# Patient Record
Sex: Female | Born: 1975 | Race: White | Hispanic: No | Marital: Married | Smoking: Never smoker
Health system: Southern US, Community
[De-identification: ages and names within clinical notes are randomized; demographics above are authoritative.]

## PROBLEM LIST (undated history)

## (undated) DIAGNOSIS — G43909 Migraine, unspecified, not intractable, without status migrainosus: Secondary | ICD-10-CM

## (undated) DIAGNOSIS — Z8619 Personal history of other infectious and parasitic diseases: Secondary | ICD-10-CM

## (undated) DIAGNOSIS — D649 Anemia, unspecified: Secondary | ICD-10-CM

## (undated) HISTORY — PX: NOSE SURGERY: SHX723

## (undated) HISTORY — PX: JOINT REPLACEMENT: SHX530

## (undated) HISTORY — PX: ABDOMINAL HYSTERECTOMY: SHX81

## (undated) HISTORY — PX: DIAGNOSTIC LAPAROSCOPY: SUR761

## (undated) HISTORY — PX: OTHER SURGICAL HISTORY: SHX169

## (undated) HISTORY — PX: TUBAL LIGATION: SHX77

---

## 2003-03-28 ENCOUNTER — Other Ambulatory Visit: Payer: Self-pay

## 2004-05-25 ENCOUNTER — Observation Stay: Payer: Self-pay

## 2004-06-08 ENCOUNTER — Observation Stay: Payer: Self-pay

## 2004-06-13 ENCOUNTER — Observation Stay: Payer: Self-pay | Admitting: Obstetrics and Gynecology

## 2004-06-15 ENCOUNTER — Emergency Department: Payer: Self-pay | Admitting: Unknown Physician Specialty

## 2004-06-15 ENCOUNTER — Observation Stay: Payer: Self-pay | Admitting: Obstetrics and Gynecology

## 2004-07-05 ENCOUNTER — Observation Stay: Payer: Self-pay

## 2004-07-18 ENCOUNTER — Inpatient Hospital Stay: Payer: Self-pay | Admitting: Obstetrics and Gynecology

## 2004-08-18 ENCOUNTER — Ambulatory Visit: Payer: Self-pay | Admitting: Pediatrics

## 2005-07-07 ENCOUNTER — Ambulatory Visit: Payer: Self-pay | Admitting: Obstetrics and Gynecology

## 2005-09-04 ENCOUNTER — Ambulatory Visit: Payer: Self-pay | Admitting: Obstetrics and Gynecology

## 2006-05-15 ENCOUNTER — Ambulatory Visit: Payer: Self-pay | Admitting: Podiatry

## 2006-06-12 ENCOUNTER — Encounter: Payer: Self-pay | Admitting: Podiatry

## 2006-07-12 ENCOUNTER — Encounter: Payer: Self-pay | Admitting: Podiatry

## 2006-11-01 ENCOUNTER — Ambulatory Visit: Payer: Self-pay | Admitting: Family Medicine

## 2009-03-08 ENCOUNTER — Ambulatory Visit: Payer: Self-pay | Admitting: Internal Medicine

## 2009-03-09 ENCOUNTER — Ambulatory Visit: Payer: Self-pay | Admitting: Internal Medicine

## 2009-10-14 ENCOUNTER — Ambulatory Visit: Payer: Self-pay | Admitting: Family Medicine

## 2009-11-02 ENCOUNTER — Emergency Department: Payer: Self-pay | Admitting: Emergency Medicine

## 2009-11-05 ENCOUNTER — Ambulatory Visit: Payer: Self-pay | Admitting: Obstetrics and Gynecology

## 2010-04-21 ENCOUNTER — Ambulatory Visit: Payer: Self-pay | Admitting: Podiatry

## 2011-03-10 ENCOUNTER — Ambulatory Visit: Payer: Self-pay | Admitting: Obstetrics and Gynecology

## 2011-11-05 ENCOUNTER — Emergency Department: Payer: Self-pay | Admitting: Internal Medicine

## 2011-11-05 LAB — COMPREHENSIVE METABOLIC PANEL
Alkaline Phosphatase: 65 U/L (ref 50–136)
Anion Gap: 7 (ref 7–16)
Bilirubin,Total: 0.5 mg/dL (ref 0.2–1.0)
Chloride: 103 mmol/L (ref 98–107)
Co2: 29 mmol/L (ref 21–32)
Creatinine: 0.8 mg/dL (ref 0.60–1.30)
EGFR (African American): >60
Glucose: 88 mg/dL (ref 65–99)
Osmolality: 275 (ref 275–301)
SGOT(AST): 31 U/L (ref 15–37)
Sodium: 139 mmol/L (ref 136–145)
Total Protein: 8.6 g/dL — ABNORMAL HIGH (ref 6.4–8.2)

## 2011-11-05 LAB — CBC WITH DIFFERENTIAL/PLATELET
Basophil %: 1.1 %
Eosinophil %: 0.5 %
HCT: 42.3 % (ref 35.0–47.0)
HGB: 14.6 g/dL (ref 12.0–16.0)
MCH: 31.2 pg (ref 26.0–34.0)
MCV: 91 fL (ref 80–100)
Monocyte #: 0.5 x10 3/mm (ref 0.2–0.9)
Monocyte %: 8.3 %
Neutrophil #: 4.2 10*3/uL (ref 1.4–6.5)
Neutrophil %: 73.7 %
Platelet: 169 10*3/uL (ref 150–440)
RBC: 4.67 10*6/uL (ref 3.80–5.20)
WBC: 5.7 10*3/uL (ref 3.6–11.0)

## 2011-11-05 LAB — URINALYSIS, COMPLETE
Blood: NEGATIVE
Nitrite: NEGATIVE
Ph: 6 (ref 4.5–8.0)
Protein: NEGATIVE
RBC,UR: <1 /HPF (ref 0–5)
Specific Gravity: 1.001 (ref 1.003–1.030)
Squamous Epithelial: 2

## 2011-11-05 LAB — WET PREP, GENITAL

## 2011-11-07 ENCOUNTER — Ambulatory Visit: Payer: Self-pay | Admitting: Obstetrics and Gynecology

## 2011-11-07 LAB — URINALYSIS, COMPLETE
Leukocyte Esterase: NEGATIVE
Protein: NEGATIVE
RBC,UR: 2 /HPF (ref 0–5)
Specific Gravity: 1.021 (ref 1.003–1.030)
WBC UR: 2 /HPF (ref 0–5)

## 2012-02-19 ENCOUNTER — Ambulatory Visit: Payer: Self-pay | Admitting: Podiatry

## 2012-11-25 ENCOUNTER — Ambulatory Visit: Payer: Self-pay | Admitting: Orthopedic Surgery

## 2013-02-20 ENCOUNTER — Ambulatory Visit: Payer: Self-pay | Admitting: Orthopedic Surgery

## 2013-06-14 ENCOUNTER — Emergency Department: Payer: Self-pay | Admitting: Emergency Medicine

## 2013-06-14 LAB — CBC WITH DIFFERENTIAL/PLATELET
BASOS ABS: 0.1 10*3/uL (ref 0.0–0.1)
BASOS PCT: 0.8 %
Eosinophil #: 0 10*3/uL (ref 0.0–0.7)
Eosinophil %: 0.1 %
HCT: 42 % (ref 35.0–47.0)
HGB: 14.1 g/dL (ref 12.0–16.0)
LYMPHS ABS: 0.2 10*3/uL — AB (ref 1.0–3.6)
Lymphocyte %: 3.2 %
MCH: 30.5 pg (ref 26.0–34.0)
MCHC: 33.6 g/dL (ref 32.0–36.0)
MCV: 91 fL (ref 80–100)
MONO ABS: 0.2 x10 3/mm (ref 0.2–0.9)
Monocyte %: 2.9 %
Neutrophil #: 6.1 10*3/uL (ref 1.4–6.5)
Neutrophil %: 93 %
PLATELETS: 138 10*3/uL — AB (ref 150–440)
RBC: 4.63 10*6/uL (ref 3.80–5.20)
RDW: 13.5 % (ref 11.5–14.5)
WBC: 6.6 10*3/uL (ref 3.6–11.0)

## 2013-06-14 LAB — URINALYSIS, COMPLETE
BLOOD: NEGATIVE
Bilirubin,UR: NEGATIVE
GLUCOSE, UR: NEGATIVE mg/dL (ref 0–75)
Leukocyte Esterase: NEGATIVE
Nitrite: NEGATIVE
Ph: 5 (ref 4.5–8.0)
Protein: 30
Specific Gravity: 1.02 (ref 1.003–1.030)
Squamous Epithelial: 12
WBC UR: 5 /HPF (ref 0–5)

## 2013-06-14 LAB — COMPREHENSIVE METABOLIC PANEL
ALBUMIN: 4.2 g/dL (ref 3.4–5.0)
ALK PHOS: 64 U/L
ALT: 20 U/L (ref 12–78)
AST: 28 U/L (ref 15–37)
Anion Gap: 5 — ABNORMAL LOW (ref 7–16)
BILIRUBIN TOTAL: 0.4 mg/dL (ref 0.2–1.0)
BUN: 10 mg/dL (ref 7–18)
CALCIUM: 9 mg/dL (ref 8.5–10.1)
CHLORIDE: 102 mmol/L (ref 98–107)
CO2: 30 mmol/L (ref 21–32)
Creatinine: 0.81 mg/dL (ref 0.60–1.30)
EGFR (African American): >60
Glucose: 110 mg/dL — ABNORMAL HIGH (ref 65–99)
Osmolality: 274 (ref 275–301)
Potassium: 3.4 mmol/L — ABNORMAL LOW (ref 3.5–5.1)
Sodium: 137 mmol/L (ref 136–145)
TOTAL PROTEIN: 8.3 g/dL — AB (ref 6.4–8.2)

## 2013-06-14 LAB — LIPASE, BLOOD: LIPASE: 75 U/L (ref 73–393)

## 2014-06-30 NOTE — Op Note (Signed)
PATIENT NAME:  Joy Brown, Joy Brown MR#:  [id number] DATE OF BIRTH:  20-Oct-1975  DATE OF PROCEDURE:  11/07/2011  PREOPERATIVE DIAGNOSES: 1. Right-sided pelvic pain.  2. Solid "exophytic lesion of the right adnexa".   POSTOPERATIVE DIAGNOSES: 1. Right-sided pelvic pain.  2. Solid "exophytic lesion of the right adnexa".  3. Firm appendix. 4. Adherent, firm right fallopian tube.  PROCEDURES:  1. Diagnostic laparoscopy.  2. Right salpingectomy.  3. Moderate adhesiolysis.  4. Appendectomy.   SURGEON: Ricky L. Logan BoresEvans, Brown.D.   ANESTHESIA: General endotracheal.   FINDINGS: As above, grossly normal ovaries bilaterally, grossly normal left fallopian tube, grossly normal cervical stump.   ESTIMATED BLOOD LOSS: Minimal.   COMPLICATIONS: None.   SPECIMENS: None.   IV FLUIDS: 1100 mL crystalloid.   PROCEDURE IN DETAIL: The patient presented to the ER Sunday with ultrasound findings as noted above, seen in the office yesterday with continued pain. Discussed options and she elected to proceed with diagnostic laparoscopy. Consent was signed.   She was taken to the operating room and placed in the supine position where anesthesia was initiated. She was placed in the dorsal lithotomy position using Jowers stirrups, prepped and draped in the usual sterile fashion. A sponge stick was placed. Foley catheter was placed and we turned our attention to the abdomen.   An 11 port was placed infraumbilically and a 5  port was placed in the left lower quadrant under direct visualization after establishment of pneumoperitoneum with findings as noted above.   An 11 port was placed in the right lower quadrant. We proceeded to dissect free a solid mass that was adherent to the right cuff. It is possibly an endometrioma. There was no evidence of pustulant material. After approximately 15 minutes of dissection, we were able to visualize and mobilize a firm indurated fallopian tube, which then removed with the Harmonic  scalpel. This was placed in an Endo Catch bag and removed through the right lower quadrant port. Next we turned our attention to the appendix.    The appendix was visualized and seemed to be somewhat firm and rubbery. I elected to proceed with appendectomy, which was carried out with preloaded clips. Stump was irrigated and there was no evidence of oozing iether in the adnexal area or tyhe mesoappendiceal region. Pressure was lowered to 6 mmHg and the pelvis was copiously irrigated. Both surgical sites were seen to be hemostatic. The procedure was felt to achieve maximum efficacy. Ports removed. Incision was closed with deep of zero, subcutaneous with 3-0 Vicryl. Steri-Strips and Band-Aids were placed.   The Foley catheter was removed at the end of the case as was the sponge stick. The patient tolerated the procedure well. I anticipate a routine postoperative course.    ____________________________ Reatha Harpsicky L. Logan BoresEvans, MD rle:bjt D: 11/07/2011 11:20:23 ET T: 11/07/2011 11:51:17 ET JOB#: [id number]  cc: Ricky L. Logan BoresEvans, MD, <Dictator> Augustina MoodICK L Taivon Haroon MD ELECTRONICALLY SIGNED 11/08/2011 9:52

## 2014-07-03 NOTE — Op Note (Signed)
PATIENT NAME:  Joy Brown, Joy Brown MR#:  [id number] DATE OF BIRTH:  1976-02-29  DATE OF PROCEDURE:  02/20/2013  PREOPERATIVE DIAGNOSIS: Left knee anterior cruciate ligament tear.   POSTOPERATIVE DIAGNOSIS: Left knee anterior cruciate ligament tear.   PROCEDURES PERFORMED:  1.  Left knee anterior cruciate ligament reconstruction. 2.  Left knee plica excision.   SURGEON: Murlean Hark M.D.   ASSISTANT: Dedra Skeens, [place]   ANESTHESIA: Femoral nerve block and general anesthesia.   ESTIMATED BLOOD LOSS: Minimal.   TOURNIQUET TIME: Zero minutes.  SURGICAL FINDINGS: A complete tear of ACL with scarring to the PCL. Medial plica band with underlying femoral trochlear inflammation.  COMPLICATIONS: No immediate intraoperative or postoperative complications were noted.   DISPOSITION: The patient will be discharged home the same day of surgery. She will follow up in the office next week.   INDICATIONS FOR PROCEDURE: Joy Brown is a 39 year old female who presented to the office for evaluation of aforementioned injury. She delayed surgery until December due to some work restrictions at her job. She is in today for planned surgery. Risks and benefits have been explained to the patient. All questions have been answered.   DESCRIPTION OF PROCEDURE: Joy Brown was identified in the preoperative holding area. The left lower extremity was marked as the operative site. Consent form was reviewed. Questions were again answered. Femoral nerve block was administered in the preoperative holding area. The patient was brought into the operating room and placed on the table in supine position. General anesthesia was administered.   Tourniquet was applied to the left lower extremity. The left lower extremity was prepared and draped in the usual sterile fashion.   Surgical timeout was performed identifying patient, procedure, laterality, imaging studies, consent form, preoperative antibiotics, and skin preparation.    Standard lateral viewing portal was made. Arthroscope was inserted into the patellofemoral compartment. Patellofemoral compartment was found to have nice central articulation without any significant chondral wear. Trochlear groove had no chondral wear. There was mild synovitis. A plica band was found in the medial aspect with some underlying inflammation on the femoral condyle. Attention was turned to the medial and lateral gutters. No loose bodies were noted. Attention was turned to the medial compartment. Under direct visualization, medial portal was made. Probe was inserted. Medial meniscus was found to have a small radial tear of the anterior horn. This was gently debrided using an incisor shaver. The body and posterior horn were completely intact and stable. The medial femoral condyle and medial tibial plateau were probed and no chondral softening was noted.   Attention was turned to the notch. ACL was found to be completely torn and scarred to the PCL. The lateral wall of the femoral notch was completely empty of any ACL insertion. Attention was turned to the lateral compartment. The lateral compartment revealed intact meniscus. On probing of the posterior horn, there was a small area of a transverse undersurface tear that was completely stable and did not communicate all the way through to the upper surface of the meniscus. There was a very small area of chondral fraying directly in line of this area that was gently debrided with a chondral shaver. The remainder of the chondral surface appeared completely intact.   Attention was returned to the patellofemoral articulation. Using a shaver, the medial plical band was excised.   At this time, a shaver was inserted into the notch and the residual ACL stump was carefully debrided away. Care was taken to preserve the complete  integrity of the PCL. The lateral wall of the femoral notch was debrided of all soft tissue. A small notchplasty was performed to  widen the very narrow notch.   The retro-femoral cutting guide was inserted through the lateral portal while the camera was placed in the medial portal. Lateral incision was made and sharp dissection was carried down through the iliotibial band. Sleeve was depressed down to the bone. Slip cutter drill measuring 10 mm was inserted with appropriate placement on the notch being confirmed intra-articularly. Femoral socket was retro-drilled. Socket was maintained by bone on all sides. The graft passing suture was inserted into the femoral socket and withdrawn through the lateral portal. At this time, the camera was moved to the lateral portal and the anterograde tibial guide was inserted through the medial portal. Skin incision was made on the anteromedial aspect of the tibia and dissection was carried down to bone. A pin was placed for the tibial tunnel at the anterior aspect of the ACL insertion and in line with the anterior horn of the lateral meniscus. This was overdrilled using a 10 mm drill. Tibial tunnel was sequentially dilated to 10 mm.   At this time, the joint was flushed of all bone debris. The passing suture was brought down through the tibial tunnel. An ACL graft link, 9.5 mm x 69 mm, prepared by Arth[employer], was inserted. The femoral tightrope button was brought into the femoral socket and under direct visualization was passed through the lateral femoral cortex and slipped on the outside of the bone. Placement of this femoral button was confirmed on fluoroscopy.   The ACL was now hoisted with gentle resistance placed through the tibial side into the femoral socket. When 20 mm, based on our markings, was nicely seated into the femoral socket, the tibial button was applied. The tibia was now tensioned into the tunnel using the same tightrope mechanism. When 20 mm of graft was seated in the tibial tunnel, both ends were again definitively tightened. The graft was found to have excellent tension in the  joint. The knee was completely extended and no impingement was noted. At this time, the camera was removed from the joint. The tibial incision was cleaned of any soft tissue. A 4.75 x 19.1 mm SwiveLock was inserted into the anterior tibia using standard technique for secondary fixation of the ACL graft. At this time, all wounds were very copiously irrigated. The deep tissue and iliotibial band was closed using 0 Vicryl suture. Subcutaneous tissue was closed using 2-0 Vicryl suture. Skin was closed using 3-0 nylon suture. Sterile dressings were applied. TENS leads were applied. Polar Care was applied. The patient was placed in a hinged knee brace, locked in extension. Joy Brown will be discharged home the same day of surgery. She will follow up in the office next week.  ____________________________ Murlean HarkShalini Dmario Russom, MD sr:sb D: 02/21/2013 15:44:14 ET T: 02/21/2013 16:01:32 ET JOB#: [id number]  cc: Murlean HarkShalini Daysha Ashmore, MD, <Dictator> Murlean HarkSHALINI Dominigue Gellner MD ELECTRONICALLY SIGNED 02/28/2013 15:12

## 2014-07-13 ENCOUNTER — Other Ambulatory Visit: Payer: Self-pay | Admitting: Obstetrics and Gynecology

## 2014-07-13 DIAGNOSIS — N644 Mastodynia: Secondary | ICD-10-CM

## 2014-07-21 ENCOUNTER — Other Ambulatory Visit: Payer: Self-pay | Admitting: Obstetrics and Gynecology

## 2014-07-21 DIAGNOSIS — Z1231 Encounter for screening mammogram for malignant neoplasm of breast: Secondary | ICD-10-CM

## 2014-07-28 ENCOUNTER — Ambulatory Visit
Admission: RE | Admit: 2014-07-28 | Discharge: 2014-07-28 | Disposition: A | Discharge status: Home / Self Care | Payer: BLUE CROSS/BLUE SHIELD | Source: Ambulatory Visit | Attending: Obstetrics and Gynecology | Admitting: Obstetrics and Gynecology

## 2014-07-28 DIAGNOSIS — Z1231 Encounter for screening mammogram for malignant neoplasm of breast: Secondary | ICD-10-CM | POA: Insufficient documentation

## 2015-06-16 ENCOUNTER — Other Ambulatory Visit: Payer: Self-pay | Admitting: Obstetrics and Gynecology

## 2015-06-16 DIAGNOSIS — Z1231 Encounter for screening mammogram for malignant neoplasm of breast: Secondary | ICD-10-CM

## 2015-08-04 ENCOUNTER — Ambulatory Visit
Admission: RE | Admit: 2015-08-04 | Discharge: 2015-08-04 | Disposition: A | Discharge status: Home / Self Care | Payer: BLUE CROSS/BLUE SHIELD | Source: Ambulatory Visit | Attending: Obstetrics and Gynecology | Admitting: Obstetrics and Gynecology

## 2015-08-04 DIAGNOSIS — Z1231 Encounter for screening mammogram for malignant neoplasm of breast: Secondary | ICD-10-CM | POA: Insufficient documentation

## 2015-08-05 ENCOUNTER — Other Ambulatory Visit: Payer: Self-pay | Admitting: Obstetrics and Gynecology

## 2015-08-05 DIAGNOSIS — N6459 Other signs and symptoms in breast: Secondary | ICD-10-CM

## 2015-08-06 ENCOUNTER — Ambulatory Visit
Admission: RE | Admit: 2015-08-06 | Discharge: 2015-08-06 | Disposition: A | Discharge status: Home / Self Care | Payer: BLUE CROSS/BLUE SHIELD | Source: Ambulatory Visit | Attending: Obstetrics and Gynecology | Admitting: Obstetrics and Gynecology

## 2015-08-06 DIAGNOSIS — N6459 Other signs and symptoms in breast: Secondary | ICD-10-CM

## 2015-08-06 DIAGNOSIS — N6489 Other specified disorders of breast: Secondary | ICD-10-CM | POA: Diagnosis not present

## 2015-08-06 DIAGNOSIS — R6889 Other general symptoms and signs: Secondary | ICD-10-CM | POA: Diagnosis present

## 2016-07-11 ENCOUNTER — Other Ambulatory Visit: Payer: Self-pay | Admitting: Obstetrics and Gynecology

## 2016-07-11 DIAGNOSIS — Z1231 Encounter for screening mammogram for malignant neoplasm of breast: Secondary | ICD-10-CM

## 2016-08-25 ENCOUNTER — Ambulatory Visit
Admission: RE | Admit: 2016-08-25 | Discharge: 2016-08-25 | Disposition: A | Discharge status: Home / Self Care | Payer: BLUE CROSS/BLUE SHIELD | Source: Ambulatory Visit | Attending: Obstetrics and Gynecology | Admitting: Obstetrics and Gynecology

## 2016-08-25 DIAGNOSIS — Z1231 Encounter for screening mammogram for malignant neoplasm of breast: Secondary | ICD-10-CM

## 2016-08-29 ENCOUNTER — Other Ambulatory Visit: Payer: Self-pay | Admitting: Obstetrics and Gynecology

## 2016-08-29 DIAGNOSIS — Z1231 Encounter for screening mammogram for malignant neoplasm of breast: Secondary | ICD-10-CM

## 2016-09-01 ENCOUNTER — Ambulatory Visit
Admission: RE | Admit: 2016-09-01 | Discharge: 2016-09-01 | Disposition: A | Discharge status: Home / Self Care | Payer: BLUE CROSS/BLUE SHIELD | Source: Ambulatory Visit | Attending: Obstetrics and Gynecology | Admitting: Obstetrics and Gynecology

## 2016-09-01 DIAGNOSIS — Z1231 Encounter for screening mammogram for malignant neoplasm of breast: Secondary | ICD-10-CM | POA: Insufficient documentation

## 2016-09-04 ENCOUNTER — Other Ambulatory Visit: Payer: Self-pay | Admitting: Obstetrics and Gynecology

## 2016-09-04 DIAGNOSIS — R928 Other abnormal and inconclusive findings on diagnostic imaging of breast: Secondary | ICD-10-CM

## 2016-09-04 DIAGNOSIS — N6489 Other specified disorders of breast: Secondary | ICD-10-CM

## 2016-09-08 ENCOUNTER — Ambulatory Visit
Admission: RE | Admit: 2016-09-08 | Discharge: 2016-09-08 | Disposition: A | Discharge status: Home / Self Care | Payer: BLUE CROSS/BLUE SHIELD | Source: Ambulatory Visit | Attending: Obstetrics and Gynecology | Admitting: Obstetrics and Gynecology

## 2016-09-08 DIAGNOSIS — R928 Other abnormal and inconclusive findings on diagnostic imaging of breast: Secondary | ICD-10-CM

## 2016-09-08 DIAGNOSIS — N6489 Other specified disorders of breast: Secondary | ICD-10-CM

## 2017-02-17 ENCOUNTER — Emergency Department: Payer: BLUE CROSS/BLUE SHIELD

## 2017-02-17 ENCOUNTER — Emergency Department
Admission: EM | Admit: 2017-02-17 | Discharge: 2017-02-17 | Disposition: A | Discharge status: Home / Self Care | Payer: BLUE CROSS/BLUE SHIELD | Attending: Emergency Medicine | Admitting: Emergency Medicine

## 2017-02-17 ENCOUNTER — Encounter: Payer: Self-pay | Admitting: Emergency Medicine

## 2017-02-17 DIAGNOSIS — G43909 Migraine, unspecified, not intractable, without status migrainosus: Secondary | ICD-10-CM | POA: Insufficient documentation

## 2017-02-17 DIAGNOSIS — R51 Headache: Secondary | ICD-10-CM | POA: Diagnosis present

## 2017-02-17 DIAGNOSIS — Z91040 Latex allergy status: Secondary | ICD-10-CM | POA: Diagnosis not present

## 2017-02-17 DIAGNOSIS — R112 Nausea with vomiting, unspecified: Secondary | ICD-10-CM | POA: Diagnosis not present

## 2017-02-17 HISTORY — DX: Migraine, unspecified, not intractable, without status migrainosus: G43.909

## 2017-02-17 MED ORDER — ACETAMINOPHEN 500 MG PO TABS
1000.0000 mg | ORAL_TABLET | Freq: Once | ORAL | Status: AC
  Administered 2017-02-17: 1000 mg via ORAL
  Filled 2017-02-17: qty 2

## 2017-02-17 MED ORDER — MORPHINE SULFATE (PF) 4 MG/ML IV SOLN
4.0000 mg | Freq: Once | INTRAVENOUS | Status: AC
  Administered 2017-02-17: 4 mg via INTRAVENOUS

## 2017-02-17 MED ORDER — MAGNESIUM SULFATE 2 GM/50ML IV SOLN
2.0000 g | Freq: Once | INTRAVENOUS | Status: AC
  Administered 2017-02-17: 2 g via INTRAVENOUS
  Filled 2017-02-17: qty 50

## 2017-02-17 MED ORDER — METOCLOPRAMIDE HCL 5 MG/ML IJ SOLN
10.0000 mg | INTRAMUSCULAR | Status: AC
  Administered 2017-02-17: 10 mg via INTRAVENOUS
  Filled 2017-02-17: qty 2

## 2017-02-17 MED ORDER — SODIUM CHLORIDE 0.9 % IV BOLUS (SEPSIS)
500.0000 mL | INTRAVENOUS | Status: AC
  Administered 2017-02-17: 500 mL via INTRAVENOUS

## 2017-02-17 MED ORDER — ONDANSETRON HCL 4 MG/2ML IJ SOLN
INTRAMUSCULAR | Status: AC
  Filled 2017-02-17: qty 2

## 2017-02-17 MED ORDER — DEXAMETHASONE SODIUM PHOSPHATE 10 MG/ML IJ SOLN
10.0000 mg | Freq: Once | INTRAMUSCULAR | Status: AC
  Administered 2017-02-17: 10 mg via INTRAVENOUS
  Filled 2017-02-17: qty 1

## 2017-02-17 MED ORDER — ONDANSETRON HCL 4 MG/2ML IJ SOLN
4.0000 mg | Freq: Once | INTRAMUSCULAR | Status: AC
  Administered 2017-02-17: 4 mg via INTRAVENOUS

## 2017-02-17 MED ORDER — DIPHENHYDRAMINE HCL 50 MG/ML IJ SOLN
25.0000 mg | INTRAMUSCULAR | Status: AC
Start: 2017-02-17 — End: 2017-02-17
  Administered 2017-02-17: 25 mg via INTRAVENOUS
  Filled 2017-02-17: qty 1

## 2017-02-17 MED ORDER — MORPHINE SULFATE (PF) 2 MG/ML IV SOLN
INTRAVENOUS | Status: AC
  Filled 2017-02-17: qty 2

## 2017-02-17 MED ORDER — KETOROLAC TROMETHAMINE 30 MG/ML IJ SOLN
15.0000 mg | Freq: Once | INTRAMUSCULAR | Status: AC
  Administered 2017-02-17: 15 mg via INTRAVENOUS
  Filled 2017-02-17: qty 1

## 2017-02-17 NOTE — ED Triage Notes (Signed)
Pt states headache since 1900 yesterday. Pt states emesis since 0200. Pt with left sided eyelid ptosis noted that pt states she has never noticed. Pt describes as "really bad, maybe the worst headache i've had." pt actively vomiting in triage.

## 2017-02-17 NOTE — ED Notes (Signed)
Pt provided with call bell and warm blanket.

## 2017-02-17 NOTE — ED Notes (Signed)
Patient transported to CT 

## 2017-02-17 NOTE — Discharge Instructions (Signed)
You have been seen in the Emergency Department (ED) for a migraine.  Please use Tylenol or Motrin as needed for symptoms, but only as written on the box, and take any regular medications that have been prescribed for you. As we have discussed, please follow up with your doctor as soon as possible regarding today?s ED visit and your headache symptoms.    Of note, as we discussed, your CT scan suggested the possibility of a condition called a Chiari malformation.  I feel it is unlikely that it is causing your acute headache tonight, but you may want to discuss it with Dr. Malvin JohnsPotter and see if he feels it is appropriate to order an outpatient MRI or other imaging study for further evaluation.  Call your doctor or return to the Emergency Department (ED) if you have a worsening headache, sudden and severe headache, confusion, slurred speech, facial droop, weakness or numbness in any arm or leg, extreme fatigue, or other symptoms that concern you.

## 2017-02-17 NOTE — ED Provider Notes (Signed)
[facility] Emergency Department Provider Note  ____________________________________________   First MD Initiated Contact with Patient 02/17/17 [phone]     (approximate)  I have reviewed the triage vital signs and the nursing notes.   HISTORY  Chief Complaint Headache    HPI Joy Brown is a 41 y.o. female with a history of frequent and severe chronic migraines who presents for evaluation of gradually worsening global headache that started yesterday and has been severe.  She developed some nausea and vomiting prior to her arrival in the emergency department.  She took her regular migraine medication in the evening but it did not help.  She has not noticed any numbness or tingling in extremities, facial droop, or other focal neurological deficits.  She states that her pain feels like her usual migraine except much more severe.  Nothing has made it better and moving around and light makes it worse.   It was noted in triage by the nurse that it seemed like she had a bit of left eyelid ptosis but the patient had not observed any change from baseline, nor has her spouse.  She denies any recent fever/chills, chest pain, shortness of breath, abdominal pain, and dysuria.  She sees Dr. Malvin Johns with neurology for management of her migraines.  Past Medical History:  Diagnosis Date  . Migraines     There are no active problems to display for this patient.   History reviewed. No pertinent surgical history.  Prior to Admission medications   Not on File    Allergies Latex  Family History  Problem Relation Age of Onset  . Cancer Father        non hodginks lymphoma  . Cancer Maternal Grandmother        vulva    Social History Social History   Tobacco Use  . Smoking status: Never Smoker  . Smokeless tobacco: Never Used  Substance Use Topics  . Alcohol use: Yes    Comment: occasional  . Drug use: No    Review of Systems Constitutional: No  fever/chills Eyes: No visual changes. +Photophobia ENT: No sore throat. Cardiovascular: Denies chest pain. Respiratory: Denies shortness of breath. Gastrointestinal: No abdominal pain.  Nausea associated with headache and one episode of emesis.  No diarrhea.  No constipation. Genitourinary: Negative for dysuria. Musculoskeletal: Negative for neck pain.  Negative for back pain. Integumentary: Negative for rash. Neurological: Negative for headaches, focal weakness or numbness.   ____________________________________________   PHYSICAL EXAM:  VITAL SIGNS: ED Triage Vitals  Enc Vitals Group     BP 02/17/17 0328 135/67     Pulse Rate 02/17/17 0328 90     Resp 02/17/17 0328 (!) 22     Temp 02/17/17 0328 98 F (36.7 C)     Temp Source 02/17/17 0328 Oral     SpO2 02/17/17 0328 100 %     Weight 02/17/17 0329 74.8 kg (165 lb)     Height 02/17/17 0329 1.651 m (5\' 5" )     Head Circumference --      Peak Flow --      Pain Score 02/17/17 0328 10     Pain Loc --      Pain Edu? --      Excl. in GC? --     Constitutional: Alert and oriented.  Generally well-appearing but does appear quite uncomfortable Eyes: Conjunctivae are normal. PERRL. EOMI. Head: Atraumatic. Cardiovascular: Normal rate, regular rhythm. Good peripheral circulation. Grossly normal heart sounds.  Respiratory: Normal respiratory effort.  No retractions. Lungs CTAB. Gastrointestinal: Soft and nontender. No distention.  Musculoskeletal: No lower extremity tenderness nor edema. No gross deformities of extremities. Neurologic:  Normal speech and language. No gross focal neurologic deficits are appreciated.  Although it is possible that her left eyelid appears slightly asymmetrical compared to the right, she has no appreciable cranial nerve deficits and I do not appreciate ptosis Skin:  Skin is warm, dry and intact. No rash noted. Psychiatric: Mood and affect are normal. Speech and behavior are  normal.  ____________________________________________   LABS (all labs ordered are listed, but only abnormal results are displayed)  Labs Reviewed - No data to display ____________________________________________  EKG  None - EKG not ordered by ED physician ____________________________________________  RADIOLOGY   Ct Head Wo Contrast  Result Date: 02/17/2017 CLINICAL DATA:  Headache. EXAM: CT HEAD WITHOUT CONTRAST TECHNIQUE: Contiguous axial images were obtained from the base of the skull through the vertex without intravenous contrast. COMPARISON:  None. FINDINGS: Brain: No subdural, epidural, or subarachnoid hemorrhage identified. The cerebellar tonsils appear to be low lying suggesting a Chiari malformation, incompletely evaluated. The cerebellum is otherwise normal. The brainstem and basal cisterns are normal. The ventricles are normal. Basal ganglia are normal. No acute cortical ischemia or infarct. No mass effect or midline shift. Vascular: No hyperdense vessel or unexpected calcification. Skull: Normal. Negative for fracture or focal lesion. Sinuses/Orbits: No acute finding. Other: None. IMPRESSION: 1. 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 2. No other intracranial abnormalities identified. Electronically Signed   By: Gerome Samavid  Williams III M.D   On: 02/17/2017 05:39    ____________________________________________   PROCEDURES  Critical Care performed: No   Procedure(s) performed:   Procedures   ____________________________________________   INITIAL IMPRESSION / ASSESSMENT AND PLAN / ED COURSE  As part of my medical decision making, I reviewed the following data within the electronic MEDICAL RECORD NUMBER Nursing notes reviewed and incorporated and Old chart reviewed    Differential diagnosis includes, but is not limited to, intracranial hemorrhage, meningitis/encephalitis, previous head trauma, cavernous venous thrombosis,  tension headache, temporal arteritis, migraine or migraine equivalent, idiopathic intracranial hypertension, and non-specific headache.  I appreciate the mention of possible left eyelid ptosis in the triage note, but on exam I do not feel that the patient's physical exam is consistent with ptosis.  Additionally even if that was present it could be a result of a complicated migraine.  Her CT scan is reassuring and I do not feel she would benefit from additional imaging at this point to rule out more unusual diagnoses such as sinus thromboses.  The CT scan did mention the possibility of Chiari malformation due to low hanging cerebellar tonsils, but for this patient with chronic migraines I think it is worth treating empirically for migraine first and having her follow-up with Dr. Malvin JohnsPotter as an outpatient to see if he wants to perform additional imaging.  If her pain is unrelieved after empiric migraine treatment, I will consider additional imaging to rule out acute or emergent condition such as cavernous venous thrombosis and acute complications of Chiari malformation.  I discussed all of this with the patient and her husband and they understand and agree with the plan.  Clinical Course as of Feb 17 757  Sat Feb 17, 2017  [id number] The patient is feeling much better.  Her medications are not done yet but she feels comfortable with the plan to go home after her medications  have completed.  I reiterated my return precautions and recommendations for outpatient follow-up with her neurologist.  She understands and agrees with the plan.  [CF]    Clinical Course User Index [CF] Loleta RoseForbach, Hailey Miles, MD    ____________________________________________  FINAL CLINICAL IMPRESSION(S) / ED DIAGNOSES  Final diagnoses:  Migraine without status migrainosus, not intractable, unspecified migraine type     MEDICATIONS GIVEN DURING THIS VISIT:  Medications  morphine 2 MG/ML injection (not administered)  morphine 4 MG/ML  injection 4 mg (4 mg Intravenous Given 02/17/17 0340)  ondansetron (ZOFRAN) injection 4 mg (4 mg Intravenous Given 02/17/17 0340)  sodium chloride 0.9 % bolus 500 mL (0 mLs Intravenous Stopped 02/17/17 0722)  ketorolac (TORADOL) 30 MG/ML injection 15 mg (15 mg Intravenous Given 02/17/17 0644)  metoCLOPramide (REGLAN) injection 10 mg (10 mg Intravenous Given 02/17/17 0645)  dexamethasone (DECADRON) injection 10 mg (10 mg Intravenous Given 02/17/17 0648)  diphenhydrAMINE (BENADRYL) injection 25 mg (25 mg Intravenous Given 02/17/17 0647)  magnesium sulfate IVPB 2 g 50 mL (0 g Intravenous Stopped 02/17/17 0758)  acetaminophen (TYLENOL) tablet 1,000 mg (1,000 mg Oral Given 02/17/17 [id number])     ED Discharge Orders    None       Note:  This document was prepared using Dragon voice recognition software and may include unintentional dictation errors.    Loleta RoseForbach, Anita Mcadory, MD 02/17/17 [phone]

## 2017-02-17 NOTE — ED Triage Notes (Signed)
Patient in recliner chair in triage with eyes closed. Patient in no acute distress at this time.

## 2017-03-01 ENCOUNTER — Other Ambulatory Visit: Payer: Self-pay | Admitting: Obstetrics and Gynecology

## 2017-03-01 DIAGNOSIS — Z1231 Encounter for screening mammogram for malignant neoplasm of breast: Secondary | ICD-10-CM

## 2017-07-29 ENCOUNTER — Other Ambulatory Visit: Payer: Self-pay

## 2017-07-29 ENCOUNTER — Emergency Department
Admission: EM | Admit: 2017-07-29 | Discharge: 2017-07-29 | Disposition: A | Discharge status: Home / Self Care | Payer: BLUE CROSS/BLUE SHIELD | Attending: Emergency Medicine | Admitting: Emergency Medicine

## 2017-07-29 DIAGNOSIS — Z91040 Latex allergy status: Secondary | ICD-10-CM | POA: Insufficient documentation

## 2017-07-29 DIAGNOSIS — J0100 Acute maxillary sinusitis, unspecified: Secondary | ICD-10-CM | POA: Diagnosis not present

## 2017-07-29 DIAGNOSIS — R51 Headache: Secondary | ICD-10-CM | POA: Diagnosis present

## 2017-07-29 DIAGNOSIS — G43001 Migraine without aura, not intractable, with status migrainosus: Secondary | ICD-10-CM | POA: Diagnosis not present

## 2017-07-29 MED ORDER — KETOROLAC TROMETHAMINE 30 MG/ML IJ SOLN
30.0000 mg | Freq: Once | INTRAMUSCULAR | Status: AC
  Administered 2017-07-29: 30 mg via INTRAVENOUS
  Filled 2017-07-29: qty 1

## 2017-07-29 MED ORDER — MORPHINE SULFATE (PF) 4 MG/ML IV SOLN
4.0000 mg | Freq: Once | INTRAVENOUS | Status: AC
  Administered 2017-07-29: 4 mg via INTRAVENOUS
  Filled 2017-07-29: qty 1

## 2017-07-29 MED ORDER — SODIUM CHLORIDE 0.9 % IV BOLUS
500.0000 mL | Freq: Once | INTRAVENOUS | Status: AC
  Administered 2017-07-29: 500 mL via INTRAVENOUS

## 2017-07-29 MED ORDER — FEXOFENADINE-PSEUDOEPHED ER 60-120 MG PO TB12: 1.0000 | ORAL_TABLET | Freq: Two times a day (BID) | ORAL | 0 refills | Status: DC

## 2017-07-29 MED ORDER — AMOXICILLIN 875 MG PO TABS: 875.0000 mg | ORAL_TABLET | Freq: Two times a day (BID) | ORAL | 0 refills | Status: DC

## 2017-07-29 MED ORDER — DIPHENHYDRAMINE HCL 50 MG/ML IJ SOLN
25.0000 mg | Freq: Once | INTRAMUSCULAR | Status: AC
  Administered 2017-07-29: 25 mg via INTRAVENOUS
  Filled 2017-07-29: qty 1

## 2017-07-29 MED ORDER — METOCLOPRAMIDE HCL 5 MG/ML IJ SOLN
10.0000 mg | Freq: Once | INTRAMUSCULAR | Status: AC
  Administered 2017-07-29: 10 mg via INTRAVENOUS
  Filled 2017-07-29: qty 2

## 2017-07-29 MED ORDER — ONDANSETRON 4 MG PO TBDP
4.0000 mg | ORAL_TABLET | Freq: Once | ORAL | Status: AC
  Administered 2017-07-29: 4 mg via ORAL
  Filled 2017-07-29: qty 1

## 2017-07-29 MED ORDER — ONDANSETRON HCL 4 MG/2ML IJ SOLN
4.0000 mg | Freq: Once | INTRAMUSCULAR | Status: AC
  Administered 2017-07-29: 4 mg via INTRAVENOUS
  Filled 2017-07-29: qty 2

## 2017-07-29 NOTE — Discharge Instructions (Addendum)
Continue previous medications

## 2017-07-29 NOTE — ED Triage Notes (Signed)
Patient reports having a migraine since early Saturday morning with nausea and vomiting.

## 2017-07-29 NOTE — ED Provider Notes (Signed)
[facility] Emergency Department Provider Note   ____________________________________________   First MD Initiated Contact with Patient 07/29/17 0801     (approximate)  I have reviewed the triage vital signs and the nursing notes.   HISTORY  Chief Complaint Migraine    HPI Joy Brown is a 42 y.o. female patient complain of migraine headaches associated with nausea and vomiting started yesterday morning.  Patient has a history of migraines followed by neurology.  Patient stated normally Imitrex has controlled her headache.  Patient also complaining of right ear and facial pain.  Patient denies fever or chills associated with this complaint.  Patient state photophobia secondary to migraine headaches.  Patient denies vertigo or weakness.  Patient rates the pain as a 10/10.  Patient described the pain is "aching".  Past Medical History:  Diagnosis Date  . Migraines     There are no active problems to display for this patient.   No past surgical history on file.  Prior to Admission medications   Medication Sig Start Date End Date Taking? Authorizing Provider  amoxicillin (AMOXIL) 875 MG tablet Take 1 tablet (875 mg total) by mouth 2 (two) times daily. 07/29/17   Joni Reining, PA-C  fexofenadine-pseudoephedrine (ALLEGRA-D) 60-120 MG 12 hr tablet Take 1 tablet by mouth 2 (two) times daily. 07/29/17   Joni Reining, PA-C    Allergies Latex  Family History  Problem Relation Age of Onset  . Cancer Father        non hodginks lymphoma  . Cancer Maternal Grandmother        vulva    Social History Social History   Tobacco Use  . Smoking status: Never Smoker  . Smokeless tobacco: Never Used  Substance Use Topics  . Alcohol use: Yes    Comment: occasional  . Drug use: No    Review of Systems Constitutional: No fever/chills Eyes: No visual changes. ENT: No sore throat.  Right maxillary guarding and edematous right ear.  Edematous nasal  turbinate thick rhinorrhea. Cardiovascular: Denies chest pain. Respiratory: Denies shortness of breath. Neurological: Positive for headaches, but denies focal weakness or numbness. Allergic/Immunilogical: Latex ____________________________________________   PHYSICAL EXAM:  VITAL SIGNS: ED Triage Vitals  Enc Vitals Group     BP 07/29/17 0606 125/83     Pulse Rate 07/29/17 0606 (!) 106     Resp 07/29/17 0606 18     Temp 07/29/17 0606 97.8 F (36.6 C)     Temp Source 07/29/17 0606 Oral     SpO2 07/29/17 0606 100 %     Weight 07/29/17 0605 155 lb (70.3 kg)     Height 07/29/17 0605  (1.651 m)     Head Circumference --      Peak Flow --      Pain Score 07/29/17 0605 10     Pain Loc --      Pain Edu? --      Excl. in GC? --    Constitutional: Alert and oriented. Well appearing and in no acute distress. Eyes: Conjunctivae are normal. PERRL. EOMI. Head: Atraumatic. Nose: Edematous nasal turbinates with thick rhinorrhea.  Right maxillary guarding with palpation. Mouth/Throat: Mucous membranes are moist.  Oropharynx non-erythematous. Neck: No stridor.  Hematological/Lymphatic/Immunilogical: No cervical lymphadenopathy. Cardiovascular: Normal rate, regular rhythm. Grossly normal heart sounds.  Good peripheral circulation. Respiratory: Normal respiratory effort.  No retractions. Lungs CTAB. Neurologic:  Normal speech and language. No gross focal neurologic deficits are appreciated. No  gait instability. Skin:  Skin is warm, dry and intact. No rash noted. Psychiatric: Mood and affect are normal. Speech and behavior are normal.  ____________________________________________   LABS (all labs ordered are listed, but only abnormal results are displayed)  Labs Reviewed - No data to display ____________________________________________  EKG   ____________________________________________  RADIOLOGY  ED MD interpretation:    Official radiology report(s): No results  found.  ____________________________________________   PROCEDURES  Procedure(s) performed: None  Procedures  Critical Care performed: No  ____________________________________________   INITIAL IMPRESSION / ASSESSMENT AND PLAN / ED COURSE  As part of my medical decision making, I reviewed the following data within the electronic MEDICAL RECORD NUMBER    Migraine headache with right maxillary sinusitis.  Patient responded well to IV rehydration, morphine, Reglan, Toradol, and Zofran.  Patient given discharge care instruction.  Patient advised continue previous migraine medication and start antibiotics and as directed.  Follow-up with PCP.      ____________________________________________   FINAL CLINICAL IMPRESSION(S) / ED DIAGNOSES  Final diagnoses:  Migraine without aura and with status migrainosus, not intractable  Subacute maxillary sinusitis     ED Discharge Orders        Ordered    amoxicillin (AMOXIL) 875 MG tablet  2 times daily     07/29/17 0921    fexofenadine-pseudoephedrine (ALLEGRA-D) 60-120 MG 12 hr tablet  2 times daily     07/29/17 1610       Note:  This document was prepared using Dragon voice recognition software and may include unintentional dictation errors.    Joni Reining, PA-C 07/29/17 9604    Merrily Brittle, MD 07/29/17 1246

## 2017-07-29 NOTE — ED Notes (Signed)
Reports headache beginning 0400 on 5/18. Reports pain improvement s/p Zofran in triage. Reports hx Migraines.

## 2017-09-04 ENCOUNTER — Ambulatory Visit
Admission: RE | Admit: 2017-09-04 | Discharge: 2017-09-04 | Disposition: A | Discharge status: Home / Self Care | Payer: BLUE CROSS/BLUE SHIELD | Source: Ambulatory Visit | Attending: Obstetrics and Gynecology | Admitting: Obstetrics and Gynecology

## 2017-09-04 DIAGNOSIS — Z1231 Encounter for screening mammogram for malignant neoplasm of breast: Secondary | ICD-10-CM | POA: Diagnosis not present

## 2018-07-29 ENCOUNTER — Other Ambulatory Visit: Payer: Self-pay | Admitting: Internal Medicine

## 2018-07-29 ENCOUNTER — Other Ambulatory Visit: Payer: Self-pay | Admitting: Obstetrics and Gynecology

## 2018-07-29 DIAGNOSIS — Z1231 Encounter for screening mammogram for malignant neoplasm of breast: Secondary | ICD-10-CM

## 2018-09-06 ENCOUNTER — Ambulatory Visit
Admission: RE | Admit: 2018-09-06 | Discharge: 2018-09-06 | Disposition: A | Discharge status: Home / Self Care | Payer: BC Managed Care – PPO | Source: Ambulatory Visit | Attending: Internal Medicine | Admitting: Internal Medicine

## 2018-09-06 ENCOUNTER — Other Ambulatory Visit: Payer: Self-pay

## 2018-09-06 DIAGNOSIS — Z1231 Encounter for screening mammogram for malignant neoplasm of breast: Secondary | ICD-10-CM | POA: Insufficient documentation

## 2018-09-06 IMAGING — MG MM DIGITAL SCREENING BILAT W/ TOMO W/ CAD
6 of 10 series · 6 of 30 positions shown · non-contrast
Comparison: Previous exam(s).

CLINICAL DATA: Screening.

EXAM:
DIGITAL SCREENING BILATERAL MAMMOGRAM WITH TOMO AND CAD

[L MLO synth-2D]
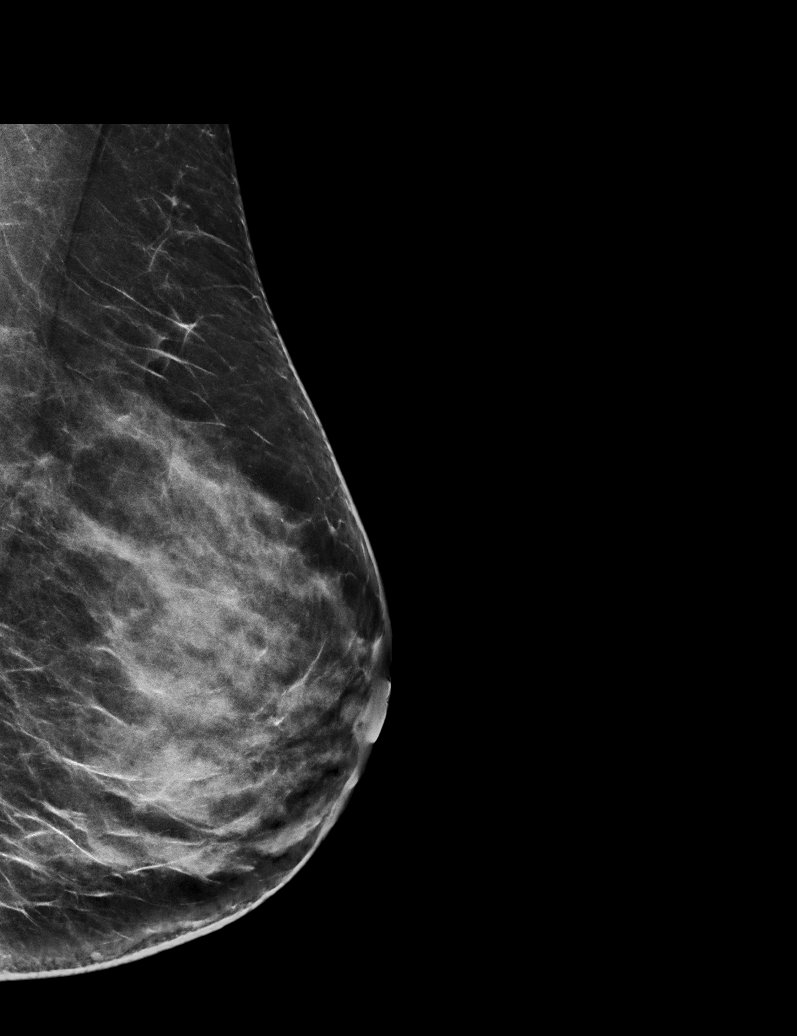

[R MLO synth-2D (1 of 2)]
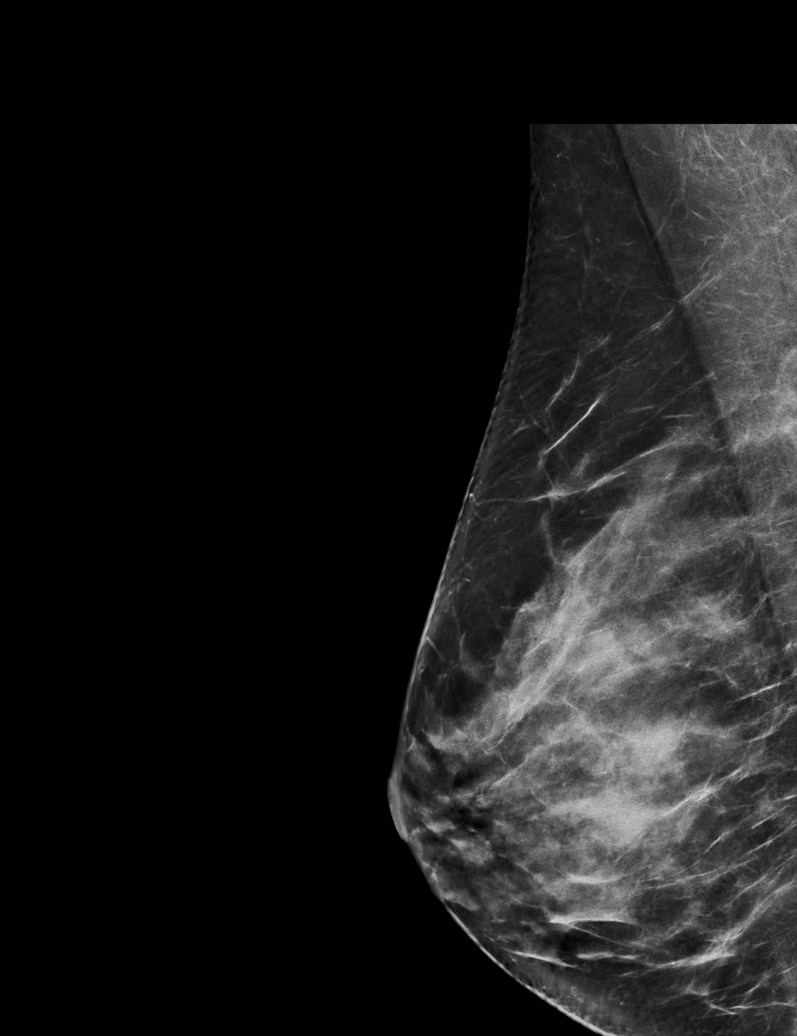

[L CC synth-2D]
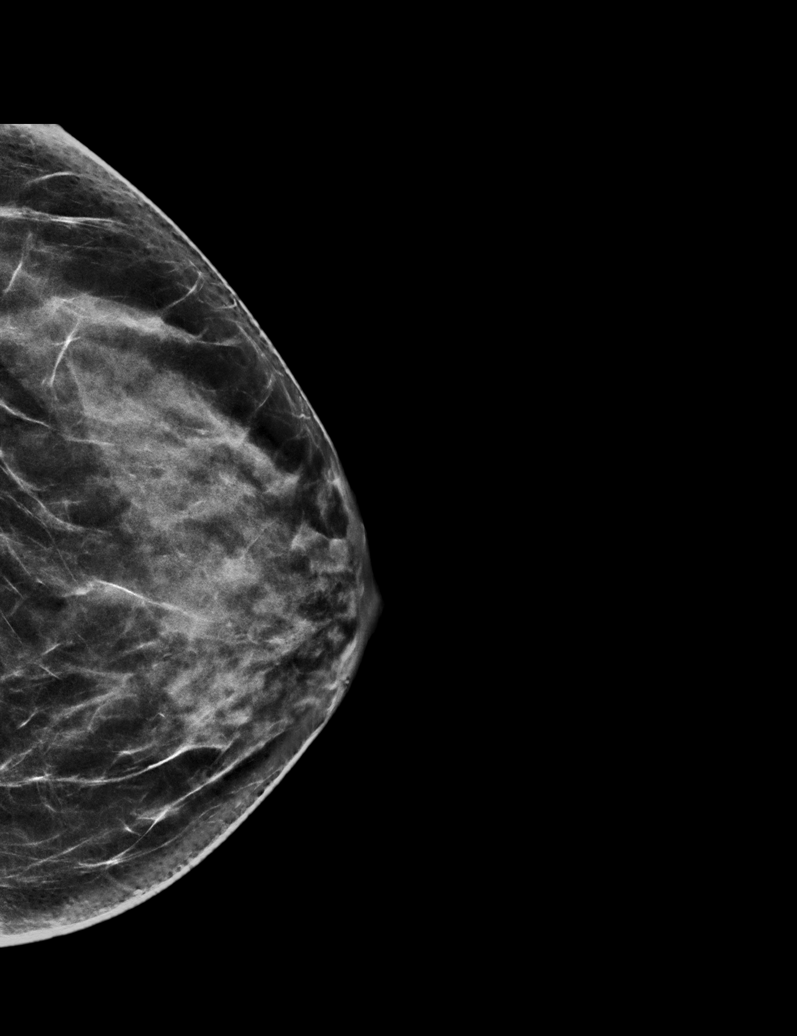

[R CC synth-2D]
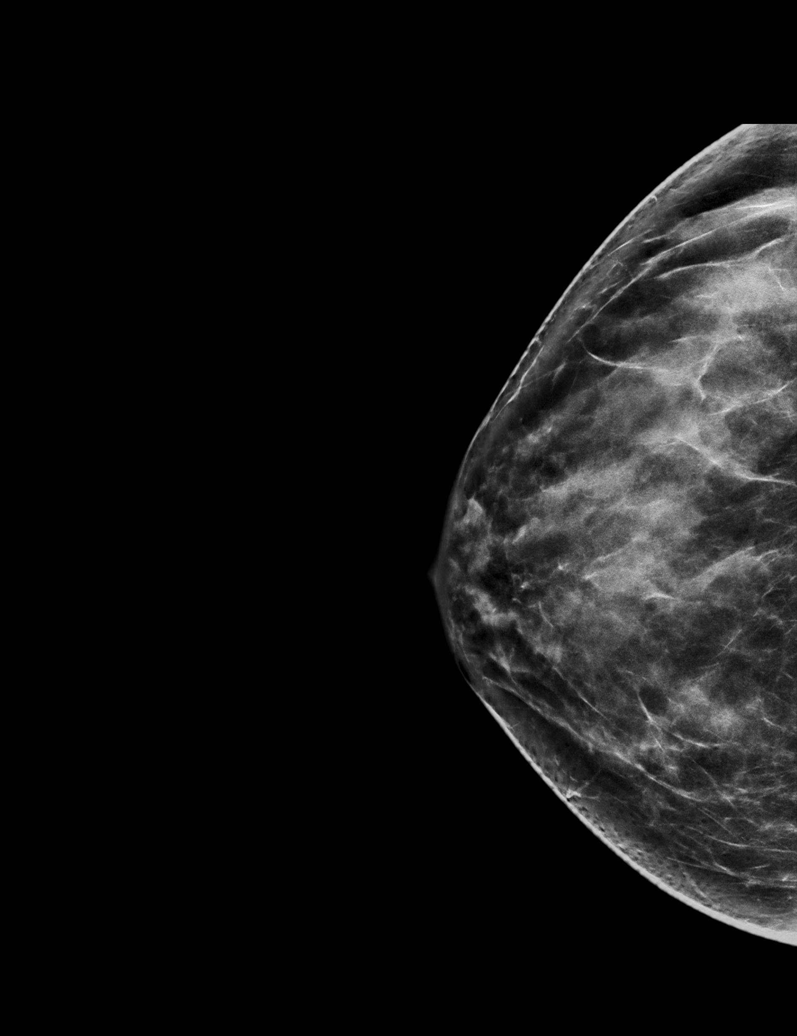

[R MLO synth-2D (2 of 2)]
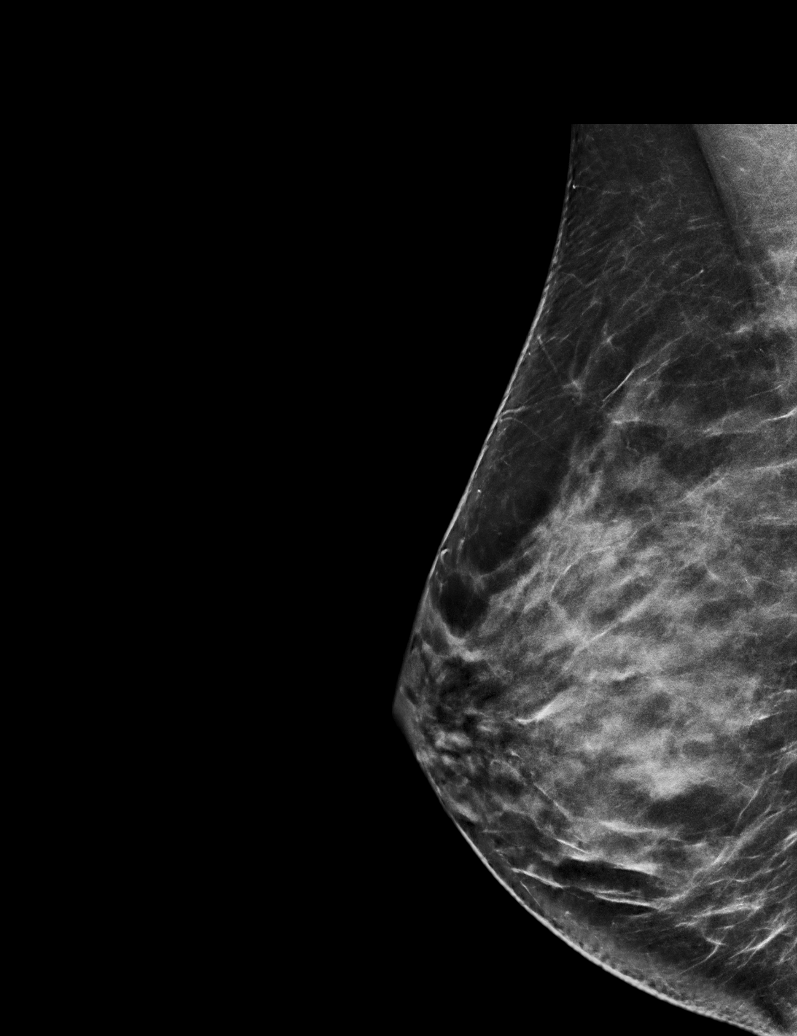

[L MLO tomo · tomo slice 35/69.0]
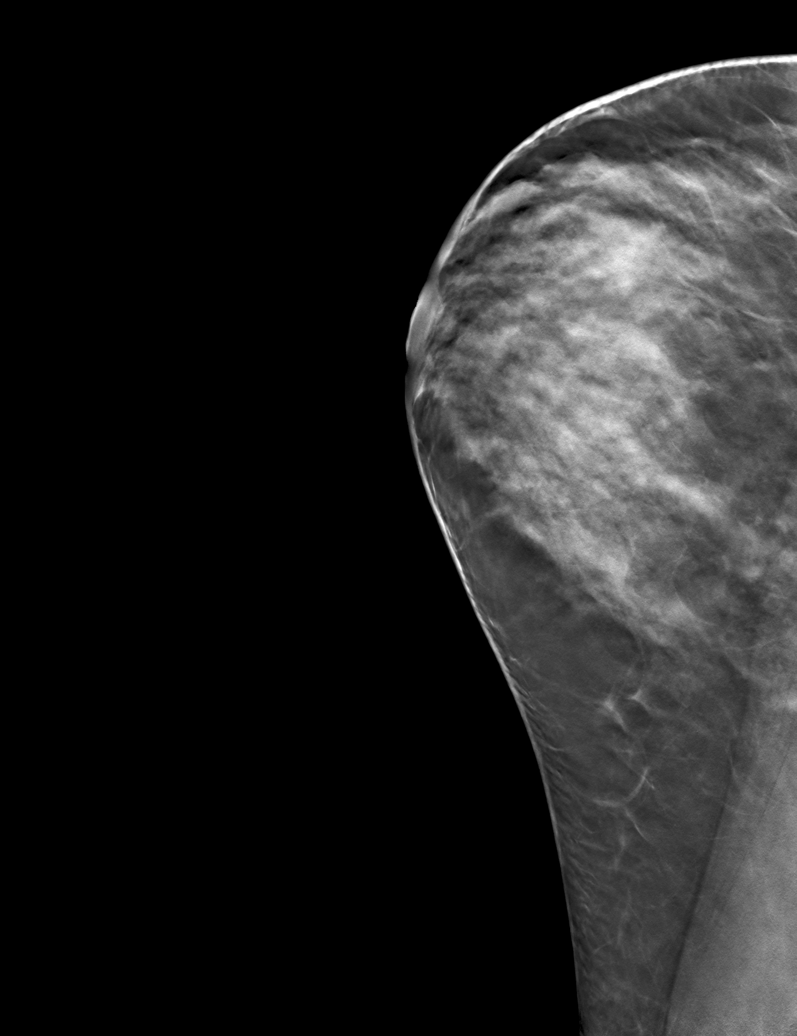

[6 of 30 positions shown; findings below may reference images not displayed]

ACR Breast Density Category c: The breast tissue is heterogeneously
dense, which may obscure small masses.
FINDINGS: There are no findings suspicious for malignancy. Images were
processed with CAD.
IMPRESSION: No mammographic evidence of malignancy. A result letter of this
screening mammogram will be mailed directly to the patient.

RECOMMENDATION:
Screening mammogram in one year. (Code:FT-U-LHB)

BI-RADS CATEGORY  1: Negative.

## 2018-09-16 MED ORDER — PROPOFOL 10 MG/ML IV BOLUS
INTRAVENOUS | Status: AC
  Filled 2018-09-16: qty 20

## 2019-08-12 ENCOUNTER — Other Ambulatory Visit: Payer: Self-pay | Admitting: Internal Medicine

## 2019-08-12 DIAGNOSIS — Z1231 Encounter for screening mammogram for malignant neoplasm of breast: Secondary | ICD-10-CM

## 2019-09-08 ENCOUNTER — Ambulatory Visit
Admission: RE | Admit: 2019-09-08 | Discharge: 2019-09-08 | Disposition: A | Discharge status: Home / Self Care | Payer: BC Managed Care – PPO | Source: Ambulatory Visit | Attending: Internal Medicine | Admitting: Internal Medicine

## 2019-09-08 DIAGNOSIS — Z1231 Encounter for screening mammogram for malignant neoplasm of breast: Secondary | ICD-10-CM | POA: Diagnosis present

## 2019-12-10 ENCOUNTER — Other Ambulatory Visit: Payer: Self-pay

## 2019-12-10 ENCOUNTER — Encounter: Payer: Self-pay | Admitting: Family Medicine

## 2019-12-10 ENCOUNTER — Ambulatory Visit (INDEPENDENT_AMBULATORY_CARE_PROVIDER_SITE_OTHER): Payer: Self-pay | Admitting: Family Medicine

## 2019-12-10 VITALS — BP 120/74 | HR 96 | Temp 98.7°F | Resp 18 | Ht 64.0 in | Wt 144.2 lb

## 2019-12-10 DIAGNOSIS — Z024 Encounter for examination for driving license: Secondary | ICD-10-CM

## 2019-12-10 NOTE — Progress Notes (Signed)
Subjective:    Patient ID: Joy Brown, female    DOB: [birth year], 44 y.o.   MRN: [id number]  Joy Brown is a 44 y.o. female presenting on 12/10/2019 for Employment Physical (DOT Physical)   HPI  [name] presents to clinic for DOT PE  No flowsheet data found.  Social History   Tobacco Use  . Smoking status: Never Smoker  . Smokeless tobacco: Never Used  Vaping Use  . Vaping Use: Never used  Substance Use Topics  . Alcohol use: Yes    Comment: occasional  . Drug use: No    Review of Systems  Constitutional: Negative.   HENT: Negative.   Eyes: Negative.   Respiratory: Negative.   Cardiovascular: Negative.   Gastrointestinal: Negative.   Endocrine: Negative.   Genitourinary: Negative.   Musculoskeletal: Negative.   Skin: Negative.   Allergic/Immunologic: Negative.   Neurological: Negative.   Hematological: Negative.   Psychiatric/Behavioral: Negative.    Per HPI unless specifically indicated above     Objective:    BP 120/74 (BP Location: Right Arm, Patient Position: Sitting, Cuff Size: Normal)   Pulse 96   Temp 98.7 F (37.1 C) (Oral)   Resp 18   Ht 5\' 4"  (1.626 m)   Wt 144 lb 3.2 oz (65.4 kg)   SpO2 100%   BMI 24.75 kg/m   Wt Readings from Last 3 Encounters:  12/10/19 144 lb 3.2 oz (65.4 kg)  07/29/17 155 lb (70.3 kg)  02/17/17 165 lb (74.8 kg)    Physical Exam Vitals reviewed.  Constitutional:      General: She is not in acute distress.    Appearance: Normal appearance. She is well-developed, well-groomed and normal weight. She is not ill-appearing or toxic-appearing.  HENT:     Head: Normocephalic and atraumatic.     Right Ear: Tympanic membrane, ear canal and external ear normal. There is no impacted cerumen.     Left Ear: Tympanic membrane, ear canal and external ear normal. There is no impacted cerumen.     Nose: Nose normal. No congestion or rhinorrhea.     Mouth/Throat:     Lips: Pink.     Mouth: Mucous membranes are moist.      Pharynx: Oropharynx is clear. Uvula midline. No oropharyngeal exudate or posterior oropharyngeal erythema.  Eyes:     General: Lids are normal. Vision grossly intact. No scleral icterus.       Right eye: No discharge.        Left eye: No discharge.     Extraocular Movements: Extraocular movements intact.     Conjunctiva/sclera: Conjunctivae normal.     Pupils: Pupils are equal, round, and reactive to light.  Neck:     Thyroid: No thyroid mass or thyromegaly.  Cardiovascular:     Rate and Rhythm: Normal rate and regular rhythm.     Pulses: Normal pulses.          Dorsalis pedis pulses are 2+ on the right side and 2+ on the left side.     Heart sounds: Normal heart sounds. No murmur heard.  No friction rub. No gallop.   Pulmonary:     Effort: Pulmonary effort is normal. No respiratory distress.     Breath sounds: Normal breath sounds.  Abdominal:     General: Abdomen is flat. Bowel sounds are normal. There is no distension.     Palpations: Abdomen is soft. There is no hepatomegaly, splenomegaly or mass.  Tenderness: There is no abdominal tenderness. There is no guarding or rebound.     Hernia: No hernia is present.  Musculoskeletal:        General: Normal range of motion.     Cervical back: Normal range of motion and neck supple. No tenderness.     Right lower leg: No edema.     Left lower leg: No edema.     Comments: Normal tone, 5/5 strength BUE & BLE  Feet:     Right foot:     Skin integrity: Skin integrity normal.     Left foot:     Skin integrity: Skin integrity normal.  Lymphadenopathy:     Cervical: No cervical adenopathy.  Skin:    General: Skin is warm and dry.     Capillary Refill: Capillary refill takes less than 2 seconds.  Neurological:     General: No focal deficit present.     Mental Status: She is alert and oriented to person, place, and time.     Cranial Nerves: No cranial nerve deficit.     Sensory: No sensory deficit.     Motor: No weakness.      Coordination: Coordination normal.     Gait: Gait normal.     Deep Tendon Reflexes: Reflexes normal.  Psychiatric:        Attention and Perception: Attention and perception normal.        Mood and Affect: Mood and affect normal.        Speech: Speech normal.        Behavior: Behavior normal. Behavior is cooperative.        Thought Content: Thought content normal.        Cognition and Memory: Cognition and memory normal.        Judgment: Judgment normal.    No results found for this or any previous visit.    Assessment & Plan:   Problem List Items Addressed This Visit      Other   Encounter for Department of Transportation (DOT) examination for trucking license - Primary    DOT Certificate provided x 2 years  Hearing test: Pass at 15' Vision: 20/25 R, 20/25 L, 20/25 Both Corrected   STOPBANG: 0/8          No orders of the defined types were placed in this encounter.   Follow up plan: Return in about 2 years (around 12/09/2021) for DOT PE.   Joy Dalton, FNP Family Nurse Practitioner [facility] 12/10/2019, 3:23 PM

## 2019-12-10 NOTE — Assessment & Plan Note (Signed)
DOT Certificate provided x 2 years  Hearing test: Pass at 15' Vision: 20/25 R, 20/25 L, 20/25 Both Corrected   STOPBANG: 0/8

## 2019-12-10 NOTE — Patient Instructions (Signed)
DOT Certificate provided x 2 years 

## 2020-07-26 ENCOUNTER — Other Ambulatory Visit: Payer: Self-pay | Admitting: Obstetrics and Gynecology

## 2020-07-26 DIAGNOSIS — Z1231 Encounter for screening mammogram for malignant neoplasm of breast: Secondary | ICD-10-CM

## 2020-09-02 ENCOUNTER — Other Ambulatory Visit: Payer: Self-pay | Admitting: Physician Assistant

## 2020-09-02 DIAGNOSIS — R9402 Abnormal brain scan: Secondary | ICD-10-CM

## 2020-09-08 ENCOUNTER — Ambulatory Visit
Admission: RE | Admit: 2020-09-08 | Discharge: 2020-09-08 | Disposition: A | Discharge status: Home / Self Care | Payer: BC Managed Care – PPO | Source: Ambulatory Visit | Attending: Obstetrics and Gynecology | Admitting: Obstetrics and Gynecology

## 2020-09-08 ENCOUNTER — Other Ambulatory Visit: Payer: Self-pay

## 2020-09-08 DIAGNOSIS — Z1231 Encounter for screening mammogram for malignant neoplasm of breast: Secondary | ICD-10-CM | POA: Diagnosis not present

## 2020-09-15 ENCOUNTER — Ambulatory Visit: Payer: Self-pay

## 2020-09-20 ENCOUNTER — Ambulatory Visit
Admission: RE | Admit: 2020-09-20 | Discharge: 2020-09-20 | Disposition: A | Discharge status: Home / Self Care | Payer: BC Managed Care – PPO | Source: Ambulatory Visit | Attending: Physician Assistant | Admitting: Physician Assistant

## 2020-09-20 ENCOUNTER — Other Ambulatory Visit: Payer: Self-pay

## 2020-09-20 DIAGNOSIS — R9402 Abnormal brain scan: Secondary | ICD-10-CM

## 2020-09-20 MED ORDER — GADOBUTROL 1 MMOL/ML IV SOLN
6.0000 mL | Freq: Once | INTRAVENOUS | Status: AC | PRN
  Administered 2020-09-20: 6 mL via INTRAVENOUS

## 2020-10-09 ENCOUNTER — Ambulatory Visit
Admission: EM | Admit: 2020-10-09 | Discharge: 2020-10-09 | Disposition: A | Discharge status: Home / Self Care | Payer: BC Managed Care – PPO | Attending: Family Medicine | Admitting: Family Medicine

## 2020-10-09 DIAGNOSIS — G43709 Chronic migraine without aura, not intractable, without status migrainosus: Secondary | ICD-10-CM | POA: Diagnosis not present

## 2020-10-09 MED ORDER — ONDANSETRON 4 MG PO TBDP: 4.0000 mg | ORAL_TABLET | Freq: Three times a day (TID) | ORAL | 0 refills | Status: DC | PRN

## 2020-10-09 MED ORDER — PROMETHAZINE HCL 25 MG PO TABS: 25.0000 mg | ORAL_TABLET | Freq: Three times a day (TID) | ORAL | 0 refills | Status: DC | PRN

## 2020-10-09 MED ORDER — PROMETHAZINE HCL 25 MG/ML IJ SOLN
25.0000 mg | Freq: Four times a day (QID) | INTRAMUSCULAR | Status: DC | PRN
  Administered 2020-10-09: 25 mg via INTRAMUSCULAR

## 2020-10-09 NOTE — ED Provider Notes (Signed)
[facility]    CSN: [id number] Arrival date & time: 10/09/20  1243      History   Chief Complaint Chief Complaint  Patient presents with   Near Syncope   Headache   Vomiting    HPI 45 year old female with a history of migraine and Chiari malformation presents with the above complaints.  Patient states that she awoke this morning and had a headache.  She states that it is currently right-sided.  She has a history of migraine.  She reports associated nausea.  She is had several bouts of emesis since developing her headache this morning.  In the parking lot, she had a near syncopal episode and was brought in via wheelchair.  Patient states that she has tried to take Imitrex, Reglan, and Maxalt but has been unable to keep anything down.  She rates her pain is 8/10 in severity.  She describes it as throbbing and aching.  Associated photophobia.  She states that she does not typically have this much nausea with her migraines.  No reports of worse headache of her life.  No fall, trauma, injury.  Past Medical History:  Diagnosis Date   Migraines     Patient Active Problem List   Diagnosis Date Noted   Encounter for Department of Transportation (DOT) examination for trucking license 03/15/7251     Home Medications    Prior to Admission medications   Medication Sig Start Date End Date Taking? Authorizing Provider  melatonin 5 MG TABS Take by mouth.   Yes [provider]  nortriptyline (PAMELOR) 10 MG capsule TAKE 20-30 MG AT NIGHT 11/01/16  Yes [provider]  ondansetron (ZOFRAN ODT) 4 MG disintegrating tablet Take 1 tablet (4 mg total) by mouth every 8 (eight) hours as needed for nausea or vomiting. 10/09/20  Yes Adriana Simas, Daeshon Grammatico G, DO  promethazine (PHENERGAN) 25 MG tablet Take 1 tablet (25 mg total) by mouth every 8 (eight) hours as needed for refractory nausea / vomiting. 10/09/20  Yes Everlene Other G, DO  rizatriptan (MAXALT) 10 MG tablet Take by mouth.  08/30/20  Yes [provider]  SUMAtriptan (IMITREX) 100 MG tablet Take 1/2-1 tab at headache onset, can repeat once in two hours if needed.  No more than 2 tabs in 24 hours 02/26/17  Yes [provider]    Family History Family History  Problem Relation Age of Onset   Cancer Father        non hodginks lymphoma   Cancer Maternal Grandmother        vulva   Breast cancer Neg Hx     Social History Social History   Tobacco Use   Smoking status: Never   Smokeless tobacco: Never  Vaping Use   Vaping Use: Never used  Substance Use Topics   Alcohol use: Yes    Comment: occasional   Drug use: No     Allergies   Latex   Review of Systems Review of Systems Per HPI  Physical Exam Triage Vital Signs ED Triage Vitals  Enc Vitals Group     BP 10/09/20 1248 129/81     Pulse Rate 10/09/20 1248 97     Resp 10/09/20 1248 18     Temp 10/09/20 1248 98.7 F (37.1 C)     Temp Source 10/09/20 1248 Oral     SpO2 10/09/20 1248 100 %     Weight 10/09/20 1248 145 lb (65.8 kg)     Height 10/09/20 1248 5'  4" (1.626 m)     Head Circumference --      Peak Flow --      Pain Score 10/09/20 1247 8     Pain Loc --      Pain Edu? --      Excl. in GC? --    No data found.  Updated Vital Signs BP 129/81 (BP Location: Right Arm)   Pulse 97   Temp 98.7 F (37.1 C) (Oral)   Resp 18   Ht 5\' 4"  (1.626 m)   Wt 65.8 kg   SpO2 100%   BMI 24.89 kg/m   Visual Acuity Right Eye Distance:   Left Eye Distance:   Bilateral Distance:    Right Eye Near:   Left Eye Near:    Bilateral Near:     Physical Exam Vitals and nursing note reviewed.  Constitutional:      General: She is not in acute distress.    Appearance: She is not ill-appearing.  HENT:     Head: Normocephalic and atraumatic.  Eyes:     General:        Right eye: No discharge.        Left eye: No discharge.     Conjunctiva/sclera: Conjunctivae normal.  Cardiovascular:     Rate and Rhythm: Normal rate  and regular rhythm.  Pulmonary:     Effort: Pulmonary effort is normal.     Breath sounds: Normal breath sounds. No wheezing, rhonchi or rales.  Neurological:     General: No focal deficit present.     Mental Status: She is alert.  Psychiatric:        Mood and Affect: Mood normal.        Behavior: Behavior normal.     UC Treatments / Results  Labs (all labs ordered are listed, but only abnormal results are displayed) Labs Reviewed - No data to display  EKG   Radiology No results found.  Procedures Procedures (including critical care time)  Medications Ordered in UC Medications  promethazine (PHENERGAN) injection 25 mg (25 mg Intramuscular Given 10/09/20 1311)    Initial Impression / Assessment and Plan / UC Course  I have reviewed the triage vital signs and the nursing notes.  Pertinent labs & imaging results that were available during my care of the patient were reviewed by me and considered in my medical decision making (see chart for details).    45 year old female presents with migraine and associated nausea and vomiting.  Presyncope related to nausea and vomiting.  She has not had a syncopal episode.  Phenergan was given here today with improvement.  Patient had no further emesis.  Sending home on Zofran and Phenergan.  Advised to go home and rest.  She can continue her home migraine medications as directed.  Final Clinical Impressions(s) / UC Diagnoses   Final diagnoses:  Chronic migraine without aura without status migrainosus, not intractable     Discharge Instructions      Go home and rest.  Medication as needed.  If you worsen, go to the ER.    ED Prescriptions     Medication Sig Dispense Auth. Provider   ondansetron (ZOFRAN ODT) 4 MG disintegrating tablet Take 1 tablet (4 mg total) by mouth every 8 (eight) hours as needed for nausea or vomiting. 20 tablet Mikelle Myrick G, DO   promethazine (PHENERGAN) 25 MG tablet Take 1 tablet (25 mg total) by  mouth every 8 (eight) hours as needed for refractory  nausea / vomiting. 30 tablet Tommie Sams, DO      PDMP not reviewed this encounter.   Tommie Sams, [place] 10/09/20 [phone]

## 2020-10-09 NOTE — Discharge Instructions (Addendum)
Go home and rest.  Medication as needed.  If you worsen, go to the ER.

## 2020-10-09 NOTE — ED Triage Notes (Signed)
Patient states that she has been having a headache since 5am. States that she started to vomit around 6am. States that she has had greater than 15 episodes of emesis. States that when she arrived here at [facility] she had a near syncopal episode. Patient states that she feels weak and fatigue. Patient states that she did take her imitrex and reglan and maxalt this morning.

## 2021-04-01 ENCOUNTER — Encounter: Payer: Self-pay | Admitting: *Deleted

## 2021-04-04 ENCOUNTER — Ambulatory Visit: Payer: BC Managed Care – PPO | Admitting: Anesthesiology

## 2021-04-04 ENCOUNTER — Encounter
Admission: RE | Disposition: A | Discharge status: Home / Self Care | Payer: Self-pay | Source: Home / Self Care | Attending: Gastroenterology

## 2021-04-04 ENCOUNTER — Encounter: Payer: Self-pay | Admitting: *Deleted

## 2021-04-04 ENCOUNTER — Ambulatory Visit
Admission: RE | Admit: 2021-04-04 | Discharge: 2021-04-04 | Disposition: A | Discharge status: Home / Self Care | Payer: BC Managed Care – PPO | Attending: Gastroenterology | Admitting: Gastroenterology

## 2021-04-04 DIAGNOSIS — G935 Compression of brain: Secondary | ICD-10-CM | POA: Diagnosis not present

## 2021-04-04 DIAGNOSIS — F419 Anxiety disorder, unspecified: Secondary | ICD-10-CM | POA: Diagnosis not present

## 2021-04-04 DIAGNOSIS — Z8371 Family history of colonic polyps: Secondary | ICD-10-CM | POA: Diagnosis not present

## 2021-04-04 DIAGNOSIS — K5730 Diverticulosis of large intestine without perforation or abscess without bleeding: Secondary | ICD-10-CM | POA: Diagnosis not present

## 2021-04-04 DIAGNOSIS — Z1211 Encounter for screening for malignant neoplasm of colon: Secondary | ICD-10-CM | POA: Diagnosis present

## 2021-04-04 HISTORY — PX: COLONOSCOPY WITH PROPOFOL: SHX5780

## 2021-04-04 HISTORY — DX: Anemia, unspecified: D64.9

## 2021-04-04 HISTORY — DX: Personal history of other infectious and parasitic diseases: Z86.19

## 2021-04-04 SURGERY — COLONOSCOPY WITH PROPOFOL
Anesthesia: General

## 2021-04-04 MED ORDER — SODIUM CHLORIDE 0.9 % IV SOLN: INTRAVENOUS | Status: DC

## 2021-04-04 MED ORDER — PROPOFOL 10 MG/ML IV BOLUS
INTRAVENOUS | Status: DC | PRN
  Administered 2021-04-04: 100 mg via INTRAVENOUS
  Administered 2021-04-04: 50 mg via INTRAVENOUS

## 2021-04-04 MED ORDER — LIDOCAINE HCL (PF) 2 % IJ SOLN
INTRAMUSCULAR | Status: AC
  Filled 2021-04-04: qty 5

## 2021-04-04 MED ORDER — PROPOFOL 500 MG/50ML IV EMUL
INTRAVENOUS | Status: AC
  Filled 2021-04-04: qty 50

## 2021-04-04 MED ORDER — LIDOCAINE 2% (20 MG/ML) 5 ML SYRINGE
INTRAMUSCULAR | Status: DC | PRN
  Administered 2021-04-04: 50 mg via INTRAVENOUS

## 2021-04-04 MED ORDER — PROPOFOL 500 MG/50ML IV EMUL
INTRAVENOUS | Status: DC | PRN
  Administered 2021-04-04: 150 ug/kg/min via INTRAVENOUS

## 2021-04-04 NOTE — Interval H&P Note (Signed)
History and Physical Interval Note:  04/04/2021 1:59 PM  Joy Brown  has presented today for surgery, with the diagnosis of colon cancer screening family history colon polyps father.  The various methods of treatment have been discussed with the patient and family. After consideration of risks, benefits and other options for treatment, the patient has consented to  Procedure(s): COLONOSCOPY WITH PROPOFOL (N/A) as a surgical intervention.  The patient's history has been reviewed, patient examined, no change in status, stable for surgery.  I have reviewed the patient's chart and labs.  Questions were answered to the patient's satisfaction.     Regis Bill  Ok to proceed with colonoscopy

## 2021-04-04 NOTE — Op Note (Signed)
[facility] Gastroenterology Patient Name: Joy Brown Procedure Date: 04/04/2021 1:54 PM MRN: [id number] Account #: [id number] Date of Birth: 01-20-1976 Admit Type: Outpatient Age: 46 Room: [facility] ENDO ROOM 3 Gender: Female Note Status: Finalized Instrument Name: Jasper Riling [id number] Procedure:             Colonoscopy Indications:           Colon cancer screening in patient at increased risk:                         Family history of 1st-degree relative with colon polyps Providers:             Andrey Farmer MD, MD Referring MD:          Baxter Hire, MD (Referring MD) Medicines:             Monitored Anesthesia Care Complications:         No immediate complications. Procedure:             Pre-Anesthesia Assessment:                        - Prior to the procedure, a History and Physical was                         performed, and patient medications and allergies were                         reviewed. The patient is competent. The risks and                         benefits of the procedure and the sedation options and                         risks were discussed with the patient. All questions                         were answered and informed consent was obtained.                         Patient identification and proposed procedure were                         verified by the physician, the nurse, the                         anesthesiologist, the anesthetist and the technician                         in the endoscopy suite. Mental Status Examination:                         alert and oriented. Airway Examination: normal                         oropharyngeal airway and neck mobility. Respiratory                         Examination: clear to auscultation. CV Examination:  normal. Prophylactic Antibiotics: The patient does not                         require prophylactic antibiotics. Prior                         Anticoagulants: The  patient has taken no previous                         anticoagulant or antiplatelet agents. ASA Grade                         Assessment: I - A normal, healthy patient. After                         reviewing the risks and benefits, the patient was                         deemed in satisfactory condition to undergo the                         procedure. The anesthesia plan was to use monitored                         anesthesia care (MAC). Immediately prior to                         administration of medications, the patient was                         re-assessed for adequacy to receive sedatives. The                         heart rate, respiratory rate, oxygen saturations,                         blood pressure, adequacy of pulmonary ventilation, and                         response to care were monitored throughout the                         procedure. The physical status of the patient was                         re-assessed after the procedure.                        After obtaining informed consent, the colonoscope was                         passed under direct vision. Throughout the procedure,                         the patient's blood pressure, pulse, and oxygen                         saturations were monitored continuously. The  Colonoscope was introduced through the anus and                         advanced to the the cecum, identified by appendiceal                         orifice and ileocecal valve. The colonoscopy was                         performed without difficulty. The patient tolerated                         the procedure well. The quality of the bowel                         preparation was good. Findings:      The perianal and digital rectal examinations were normal.      Multiple small-mouthed diverticula were found in the sigmoid colon,       descending colon, splenic flexure, transverse colon, hepatic flexure and       ascending  colon.      The exam was otherwise without abnormality on direct and retroflexion       views. Impression:            - Diverticulosis in the sigmoid colon, in the                         descending colon, at the splenic flexure, in the                         transverse colon, at the hepatic flexure and in the                         ascending colon.                        - The examination was otherwise normal on direct and                         retroflexion views.                        - No specimens collected. Recommendation:        - Discharge patient to home.                        - Resume previous diet.                        - Continue present medications.                        - Repeat colonoscopy in 10 years for screening                         purposes.                        - Return to referring physician as previously  scheduled. Procedure Code(s):     --- Professional ---                        M1848, Colorectal cancer screening; colonoscopy on                         individual at high risk Diagnosis Code(s):     --- Professional ---                        Z83.71, Family history of colonic polyps                        K57.30, Diverticulosis of large intestine without                         perforation or abscess without bleeding CPT copyright 2019 American Medical Association. All rights reserved. The codes documented in this report are preliminary and upon coder review may  be revised to meet current compliance requirements. Andrey Farmer MD, MD 04/04/2021 2:25:49 PM Number of Addenda: 0 Note Initiated On: 04/04/2021 1:54 PM Scope Withdrawal Time: 0 hours 7 minutes 42 seconds  Total Procedure Duration: 0 hours 13 minutes 36 seconds  Estimated Blood Loss:  Estimated blood loss: none.      [facility]

## 2021-04-04 NOTE — Transfer of Care (Signed)
Immediate Anesthesia Transfer of Care Note  Patient: Renelda Loma  Procedure(s) Performed: COLONOSCOPY WITH PROPOFOL  Patient Location: Endoscopy Unit  Anesthesia Type:General  Level of Consciousness: awake  Airway & Oxygen Therapy: Patient Spontanous Breathing  Post-op Assessment: Report given to RN and Post -op Vital signs reviewed and stable  Post vital signs: Reviewed and stable  Last Vitals:  Vitals Value Taken Time  BP 109/70 04/04/21 1428  Temp 36.1 C 04/04/21 1427  Pulse 88 04/04/21 1429  Resp 22 04/04/21 1429  SpO2 100 % 04/04/21 1429  Vitals shown include unvalidated device data.  Last Pain:  Vitals:   04/04/21 1427  TempSrc: Temporal  PainSc: 0-No pain         Complications: No notable events documented.

## 2021-04-04 NOTE — H&P (Signed)
Outpatient short stay form Pre-procedure 04/04/2021  Regis Bill, MD  Primary Physician: Gracelyn Nurse, MD  Reason for visit:  Screening colonoscopy  History of present illness:    46 y/o lady with history of colon polyps in her father of unknown size. Here for index screening colonoscopy. No blood thinners. No family history of GI malignancies. No significant abdominal surgeries except c-section.    Current Facility-Administered Medications:    0.9 %  sodium chloride infusion, , Intravenous, Continuous, Shinita Mac, Rossie Muskrat, MD  Medications Prior to Admission  Medication Sig Dispense Refill Last Dose   clobetasol ointment (TEMOVATE) 0.05 % Apply 1 application topically 2 (two) times a week.      nortriptyline (PAMELOR) 10 MG capsule TAKE 20-30 MG AT NIGHT   04/03/2021 at 0800   SUMAtriptan (IMITREX) 100 MG tablet Take 1/2-1 tab at headache onset, can repeat once in two hours if needed.  No more than 2 tabs in 24 hours   04/03/2021 at 0800   melatonin 5 MG TABS Take by mouth.      ondansetron (ZOFRAN ODT) 4 MG disintegrating tablet Take 1 tablet (4 mg total) by mouth every 8 (eight) hours as needed for nausea or vomiting. 20 tablet 0    promethazine (PHENERGAN) 25 MG tablet Take 1 tablet (25 mg total) by mouth every 8 (eight) hours as needed for refractory nausea / vomiting. 30 tablet 0    rizatriptan (MAXALT) 10 MG tablet Take by mouth.        Allergies  Allergen Reactions   Latex      Past Medical History:  Diagnosis Date   Anemia    History of chicken pox    Migraines     Review of systems:  Otherwise negative.    Physical Exam  Gen: Alert, oriented. Appears stated age.  HEENT: PERRLA. Lungs: No respiratory distress CV: RRR Abd: soft, benign, no masses Ext: No edema    Planned procedures: Proceed with colonoscopy. The patient understands the nature of the planned procedure, indications, risks, alternatives and potential complications including but not  limited to bleeding, infection, perforation, damage to internal organs and possible oversedation/side effects from anesthesia. The patient agrees and gives consent to proceed.  Please refer to procedure notes for findings, recommendations and patient disposition/instructions.     Regis Bill, MD [facility] Gastroenterology

## 2021-04-04 NOTE — Anesthesia Postprocedure Evaluation (Signed)
Anesthesia Post Note  Patient: Joy Brown  Procedure(s) Performed: COLONOSCOPY WITH PROPOFOL  Patient location during evaluation: PACU Anesthesia Type: General Level of consciousness: awake and alert, oriented and patient cooperative Pain management: pain level controlled Vital Signs Assessment: post-procedure vital signs reviewed and stable Respiratory status: spontaneous breathing, nonlabored ventilation and respiratory function stable Cardiovascular status: blood pressure returned to baseline and stable Postop Assessment: adequate PO intake Anesthetic complications: no   No notable events documented.   Last Vitals:  Vitals:   04/04/21 1437 04/04/21 1447  BP: (!) 130/97 122/90  Pulse: 87 86  Resp: 20 14  Temp:    SpO2: 100% 97%    Last Pain:  Vitals:   04/04/21 1447  TempSrc:   PainSc: 0-No pain                 Reed Breech

## 2021-04-04 NOTE — Anesthesia Preprocedure Evaluation (Signed)
Anesthesia Evaluation  Patient identified by MRN, date of birth, ID band Patient awake    Reviewed: Allergy & Precautions, NPO status , Patient's Chart, lab work & pertinent test results  History of Anesthesia Complications Negative for: history of anesthetic complications  Airway Mallampati: III   Neck ROM: Full    Dental no notable dental hx.    Pulmonary neg pulmonary ROS,    Pulmonary exam normal breath sounds clear to auscultation       Cardiovascular Exercise Tolerance: Good negative cardio ROS Normal cardiovascular exam Rhythm:Regular Rate:Normal     Neuro/Psych  Headaches, PSYCHIATRIC DISORDERS Anxiety Chiari I malformation    GI/Hepatic negative GI ROS,   Endo/Other  negative endocrine ROS  Renal/GU negative Renal ROS     Musculoskeletal   Abdominal   Peds  Hematology  (+) Blood dyscrasia, anemia ,   Anesthesia Other Findings   Reproductive/Obstetrics                             Anesthesia Physical Anesthesia Plan  ASA: 2  Anesthesia Plan: General   Post-op Pain Management:    Induction: Intravenous  PONV Risk Score and Plan: 3 and Propofol infusion, TIVA and Treatment may vary due to age or medical condition  Airway Management Planned: Natural Airway  Additional Equipment:   Intra-op Plan:   Post-operative Plan:   Informed Consent: I have reviewed the patients History and Physical, chart, labs and discussed the procedure including the risks, benefits and alternatives for the proposed anesthesia with the patient or authorized representative who has indicated his/her understanding and acceptance.       Plan Discussed with: CRNA  Anesthesia Plan Comments: (LMA/GETA backup discussed.  Patient consented for risks of anesthesia including but not limited to:  - adverse reactions to medications - damage to eyes, teeth, lips or other oral mucosa - nerve damage due  to positioning  - sore throat or hoarseness - damage to heart, brain, nerves, lungs, other parts of body or loss of life  Informed patient about role of CRNA in peri- and intra-operative care.  Patient voiced understanding.)        Anesthesia Quick Evaluation

## 2021-04-05 ENCOUNTER — Encounter: Payer: Self-pay | Admitting: Gastroenterology

## 2021-08-12 ENCOUNTER — Other Ambulatory Visit: Payer: Self-pay | Admitting: Obstetrics and Gynecology

## 2021-08-12 DIAGNOSIS — Z1231 Encounter for screening mammogram for malignant neoplasm of breast: Secondary | ICD-10-CM

## 2021-09-14 ENCOUNTER — Ambulatory Visit
Admission: RE | Admit: 2021-09-14 | Discharge: 2021-09-14 | Disposition: A | Discharge status: Home / Self Care | Payer: BC Managed Care – PPO | Source: Ambulatory Visit | Attending: Obstetrics and Gynecology | Admitting: Obstetrics and Gynecology

## 2021-09-14 DIAGNOSIS — Z1231 Encounter for screening mammogram for malignant neoplasm of breast: Secondary | ICD-10-CM | POA: Diagnosis present

## 2021-12-13 ENCOUNTER — Ambulatory Visit
Admission: RE | Admit: 2021-12-13 | Discharge: 2021-12-13 | Disposition: A | Discharge status: Home / Self Care | Payer: BC Managed Care – PPO | Source: Ambulatory Visit | Attending: Urology | Admitting: Urology

## 2021-12-13 ENCOUNTER — Telehealth: Payer: Self-pay

## 2021-12-13 ENCOUNTER — Ambulatory Visit (INDEPENDENT_AMBULATORY_CARE_PROVIDER_SITE_OTHER): Payer: BC Managed Care – PPO | Admitting: Urology

## 2021-12-13 VITALS — BP 144/91 | HR 91 | Ht 65.0 in | Wt 156.0 lb

## 2021-12-13 DIAGNOSIS — R109 Unspecified abdominal pain: Secondary | ICD-10-CM | POA: Insufficient documentation

## 2021-12-13 DIAGNOSIS — R8290 Unspecified abnormal findings in urine: Secondary | ICD-10-CM | POA: Diagnosis present

## 2021-12-13 DIAGNOSIS — N390 Urinary tract infection, site not specified: Secondary | ICD-10-CM

## 2021-12-13 LAB — URINALYSIS, COMPLETE
Bilirubin, UA: NEGATIVE
Glucose, UA: NEGATIVE
Ketones, UA: NEGATIVE
Leukocytes,UA: NEGATIVE
Nitrite, UA: NEGATIVE
Protein,UA: NEGATIVE
RBC, UA: NEGATIVE
Specific Gravity, UA: 1.015 (ref 1.005–1.030)
Urobilinogen, Ur: 0.2 mg/dL (ref 0.2–1.0)
pH, UA: 7 (ref 5.0–7.5)

## 2021-12-13 LAB — MICROSCOPIC EXAMINATION
Epithelial Cells (non renal): >10 /hpf — AB (ref 0–10)
RBC, Urine: NONE SEEN /hpf (ref 0–2)

## 2021-12-13 LAB — BLADDER SCAN AMB NON-IMAGING: Scan Result: 0

## 2021-12-13 NOTE — Telephone Encounter (Signed)
-----   Message from Hollice Espy, MD sent at 12/13/2021  4:38 PM EDT ----- RUS normal, no kidney issues.  Probably MSK pain.  Hollice Espy, MD

## 2021-12-13 NOTE — Progress Notes (Signed)
12/13/2021 2:17 PM   Joy Brown 02-24-1976 [id number]  Referring provider: Baxter Hire, MD [name],  Joy Brown [id number]  Chief Complaint  Patient presents with   Urinary Tract Infection    HPI: 46 year old female who presents today for further evaluation of recurrent UTIs.  She is seen and evaluated on 11/23/2021 for UTI symptoms consistent with UTI which included foul-smelling urine and urinary frequency urgency.  She ended up growing E. coli which was primarily pansensitive.  She was prescribed Keflex 100 mg twice a day for 5 days.  She reports that her urinary frequency urgency symptoms improved but her urine remained foul-smelling.  When she returned back from the beach, she then developed right flank pain.  Is been constant, squeezing on the right.  Not related to physical activity.  She went back to her primary care and requested a urology referral and was given additional 5 days of antibiotic which she completed.  She is most concerned today about the smell in her urine.  She reports that about monthly, she will have foul-smelling urine which is stagnant.  Her husband will tell her to turn the fan on.  She has a fear because she remembers the foul-smelling urinary odor from her grandfather and does not want to be in that situation.  She thinks it is because she is getting recurrent infections but has no other symptoms related to this.  She denies taking any supplements other than her prescribed medication.  No history of kidney stones.  No dysuria or gross hematuria.  Urinalysis today is completely negative.   PMH: Past Medical History:  Diagnosis Date   Anemia    History of chicken pox    Migraines     Surgical History: Past Surgical History:  Procedure Laterality Date   ABDOMINAL HYSTERECTOMY     CESAREAN SECTION     COLONOSCOPY WITH PROPOFOL N/A 04/04/2021   Procedure: COLONOSCOPY WITH PROPOFOL;  Surgeon: Joy Rubenstein, MD;   Location: [facility] ENDOSCOPY;  Service: Endoscopy;  Laterality: N/A;   DIAGNOSTIC LAPAROSCOPY     JOINT REPLACEMENT     KNEE ASL RECONSTRUCTION Left    NOSE SURGERY     TUBAL LIGATION     WISDOM TEETH REMOVAL      Home Medications:  Allergies as of 12/13/2021       Reactions   Latex         Medication List        Accurate as of December 13, 2021  2:17 PM. If you have any questions, ask your nurse or doctor.          clobetasol ointment 0.05 % Commonly known as: TEMOVATE Apply 1 application topically 2 (two) times a week.   Dotti 0.025 MG/24HR Generic drug: estradiol Place onto the skin.   escitalopram 10 MG tablet Commonly known as: LEXAPRO Take 10 mg by mouth daily.   melatonin 5 MG Tabs Take by mouth.   nortriptyline 10 MG capsule Commonly known as: PAMELOR TAKE 20-30 MG AT NIGHT   ondansetron 4 MG disintegrating tablet Commonly known as: Zofran ODT Take 1 tablet (4 mg total) by mouth every 8 (eight) hours as needed for nausea or vomiting.   promethazine 25 MG tablet Commonly known as: PHENERGAN Take 1 tablet (25 mg total) by mouth every 8 (eight) hours as needed for refractory nausea / vomiting.   rizatriptan 10 MG tablet Commonly known as: MAXALT Take by mouth.   SUMAtriptan  100 MG tablet Commonly known as: IMITREX Take 1/2-1 tab at headache onset, can repeat once in two hours if needed.  No more than 2 tabs in 24 hours        Allergies:  Allergies  Allergen Reactions   Latex     Family History: Family History  Problem Relation Age of Onset   Cancer Father        non hodginks lymphoma   Cancer Maternal Grandmother        vulva   Breast cancer Neg Hx     Social History:  reports that she has never smoked. She has never used smokeless tobacco. She reports current alcohol use. She reports that she does not use drugs.   Physical Exam: BP (!) 144/91   Pulse 91   Ht 5\' 5"  (1.651 m)   Wt 156 lb (70.8 kg)   BMI 25.96 kg/m    Constitutional:  Alert and oriented, No acute distress. HEENT: [place] AT, moist mucus membranes.  Trachea midline, no masses. Cardiovascular: No clubbing, cyanosis, or edema. Respiratory: Normal respiratory effort, no increased work of breathing. GI: Abdomen is soft, nontender, nondistended, no abdominal masses GU: Right CVA tenderness to percussion Skin: No rashes, bruises or suspicious lesions. Neurologic: Grossly intact, no focal deficits, moving all 4 extremities. Psychiatric: Normal mood and affect.  Laboratory Data: Lab Results  Component Value Date   WBC 6.6 06/14/2013   HGB 14.1 06/14/2013   HCT 42.0 06/14/2013   MCV 91 06/14/2013   PLT 138 (L) 06/14/2013    Lab Results  Component Value Date   CREATININE 0.81 06/14/2013    Pertinent Imaging: Results for orders placed or performed in visit on 12/13/21  Bladder Scan (Post Void Residual) in office  Result Value Ref Range   Scan Result 0      Assessment & Plan:    1. Foul smelling urine We discussed the differential diagnosis for foul-smelling urine which could include infection but can include other etiologies including concentrated urine, urinary stasis, supplements or dietary factors amongst others.  We discussed that this is not a specific indication of infection and should not be used as an indicator to move forward with UTI work-up i.e. urinalysis and culture  We discussed more specific symptoms including urinary urgency, frequency, flank pain, fevers chills amongst others is more specific UTI type symptoms to help guide if and when to check a urinalysis  I recommended consideration of the addition of daily cranberry tablets for urinary acidification and increasing hydration.  Urinalysis today is negative which is reassuring  She does not in fact have recurrent UTIs, has only had 1 documented infection over the past year - Urinalysis, Complete - Bladder Scan (Post Void Residual) in office - US RENAL;  Future  2. Right flank pain Etiology is unclear, there is right CVA tenderness today but her urinalysis is negative  No recent upper tract imaging, will proceed with stat renal ultrasound.  She is agreeable this plan.  If this is abnormal, will escalate imaging modality to CT. - US RENAL; Future   Joy Espy, MD  [place] [address], [place], [place] [address] [phone]

## 2021-12-13 NOTE — Telephone Encounter (Signed)
Informed patient of results

## 2022-02-24 ENCOUNTER — Other Ambulatory Visit: Payer: Self-pay | Admitting: Orthopedic Surgery

## 2022-02-24 DIAGNOSIS — M4802 Spinal stenosis, cervical region: Secondary | ICD-10-CM

## 2022-03-08 ENCOUNTER — Ambulatory Visit
Admission: RE | Admit: 2022-03-08 | Discharge: 2022-03-08 | Disposition: A | Discharge status: Home / Self Care | Payer: BC Managed Care – PPO | Source: Ambulatory Visit | Attending: Orthopedic Surgery | Admitting: Orthopedic Surgery

## 2022-03-08 DIAGNOSIS — M4802 Spinal stenosis, cervical region: Secondary | ICD-10-CM

## 2022-03-14 ENCOUNTER — Telehealth: Payer: Self-pay

## 2022-03-14 NOTE — Telephone Encounter (Signed)
She confirmed appt for 03/28/2022.

## 2022-03-14 NOTE — Telephone Encounter (Signed)
You can schedule with Dr. Izora Ribas.

## 2022-03-14 NOTE — Telephone Encounter (Signed)
-----   Message from Peggyann Shoals sent at 03/14/2022 11:48 AM EST ----- Regarding: referral Contact: [phone] Do you have this referral in your stack? Patient said that she has not done PT or tried injections. Should she see Dr.Y or a PA? IMPRESSION: 1. 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 2. 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 3. 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 No syrinx. 4. 1.5 cm right thyroid nodule, indeterminate. Further evaluation with dedicated thyroid ultrasound recommended (ref: J Am Coll Radiol. 2015 Feb;12(2): 143-50).

## 2022-03-27 NOTE — Progress Notes (Unsigned)
Referring Physician:  Hessie Knows, MD [address] [facility] [place],  [place] [address]  Primary Physician:  Baxter Hire, MD  History of Present Illness: 03/27/2022 Joy Brown is here today with a chief complaint of neck pain, into the left arm about 7-8 months ago.  October/November 2023 when she started dropping things. Numbness and in the left arm, pain in the right arm.   She was seen by Dr. Lacinda Axon on 10/26/20 for Chiari Malformation and she was not interested in surgery, so he told her to follow up as needed   Past Surgery: denies  [name] has ***no symptoms of cervical myelopathy.  The symptoms are causing a significant impact on the patient's life.   I have utilized the care everywhere function in epic to review the outside records available from external health systems.  Review of Systems:  A 10 point review of systems is negative, except for the pertinent positives and negatives detailed in the HPI.  Past Medical History: Past Medical History:  Diagnosis Date   Anemia    History of chicken pox    Migraines     Past Surgical History: Past Surgical History:  Procedure Laterality Date   ABDOMINAL HYSTERECTOMY     CESAREAN SECTION     COLONOSCOPY WITH PROPOFOL N/A 04/04/2021   Procedure: COLONOSCOPY WITH PROPOFOL;  Surgeon: Lesly Rubenstein, MD;  Location: [facility] ENDOSCOPY;  Service: Endoscopy;  Laterality: N/A;   DIAGNOSTIC LAPAROSCOPY     JOINT REPLACEMENT     KNEE ASL RECONSTRUCTION Left    NOSE SURGERY     TUBAL LIGATION     WISDOM TEETH REMOVAL      Allergies: Allergies as of 03/28/2022 - Review Complete 12/13/2021  Allergen Reaction Noted   Latex  02/17/2017    Medications: No outpatient medications have been marked as taking for the 03/28/22 encounter (Appointment) with Meade Maw, MD.    Social History: Social History   Tobacco Use   Smoking status: Never   Smokeless tobacco: Never   Vaping Use   Vaping Use: Never used  Substance Use Topics   Alcohol use: Yes    Comment: occasional   Drug use: No    Family Medical History: Family History  Problem Relation Age of Onset   Cancer Father        non hodginks lymphoma   Cancer Maternal Grandmother        vulva   Breast cancer Neg Hx     Physical Examination: There were no vitals filed for this visit.  General: Patient is well developed, well nourished, calm, collected, and in no apparent distress. Attention to examination is appropriate.  Neck:   Supple.  Full range of motion.  Respiratory: Patient is breathing without any difficulty.   NEUROLOGICAL:     Awake, alert, oriented to person, place, and time.  Speech is clear and fluent.   Cranial Nerves: Pupils equal round and reactive to light.  Facial tone is symmetric.  Facial sensation is symmetric. Shoulder shrug is symmetric. Tongue protrusion is midline.  There is no pronator drift.  ROM of spine: full.    Strength: Side Biceps Triceps Deltoid Interossei Grip Wrist Ext. Wrist Flex.  R 5 5 5 5 5 5 5   L 5 5 5 5 5 5 5    Side Iliopsoas Quads Hamstring PF DF EHL  R 5 5 5 5 5 5   L 5 5 5 5  5  5   Reflexes are ***2+ and symmetric at the biceps, triceps, brachioradialis, patella and achilles.   Hoffman's is absent.   Bilateral upper and lower extremity sensation is intact to light touch.    No evidence of dysmetria noted.  Gait is normal.     Medical Decision Making  Imaging: ***  I have personally reviewed the images and agree with the above interpretation.  Assessment and Plan: Ms. Whitener is a pleasant 47 y.o. female with ***    Thank you for involving me in the care of this patient.      Chester K. Izora Ribas MD, [facility] Neurosurgery

## 2022-03-28 ENCOUNTER — Encounter: Payer: Self-pay | Admitting: Neurosurgery

## 2022-03-28 ENCOUNTER — Ambulatory Visit (INDEPENDENT_AMBULATORY_CARE_PROVIDER_SITE_OTHER): Payer: BC Managed Care – PPO | Admitting: Neurosurgery

## 2022-03-28 VITALS — BP 118/78 | Ht 65.0 in | Wt 159.6 lb

## 2022-03-28 DIAGNOSIS — M5412 Radiculopathy, cervical region: Secondary | ICD-10-CM | POA: Diagnosis not present

## 2022-03-28 DIAGNOSIS — E041 Nontoxic single thyroid nodule: Secondary | ICD-10-CM

## 2022-03-28 DIAGNOSIS — G935 Compression of brain: Secondary | ICD-10-CM

## 2022-03-28 DIAGNOSIS — M542 Cervicalgia: Secondary | ICD-10-CM | POA: Diagnosis not present

## 2022-03-28 NOTE — Patient Instructions (Signed)
The phone number to [facility] Physical Therapy is [phone], you can call them to schedule your appointment.  The number to Physiatry is [phone]

## 2022-03-28 NOTE — Progress Notes (Signed)
Referring Physician:  Hessie Knows, MD [address] [facility] [place],  [place] [address]  Primary Physician:  Baxter Hire, MD  History of Present Illness: 03/28/2022 [name] is here today with a chief complaint of neck pain that radiates into the right arm and numbness and tingling in the left arm/hand. In October 2023 she started noticing that she was dropping things.   She was seen by Dr. Lacinda Axon on 10/26/20 for Chiari Malformation and she was not interested in surgery, so he told her to follow up as needed.  Similar to her presentation at that time, she does not have any clearly Chiari related headaches at this time.  She has a headache disorder, but it is more consistent with migraine headaches.  She does not have headaches with sneezing or coughing.   She reports 7 to 8 months of numbness and tingling in her left greater than right arm.  She gets pain into her trapezius muscle on the right side particular when she rotates her head to the left.  She has numbness in her third and fourth fingers.  She has been dropping items at times.  She has limited range of motion which causes pain in her neck.  She did have a left-sided carpal tunnel injection which did help her symptoms.  Bowel/Bladder Dysfunction: none  Conservative measures: EMG on 02/14/22 Physical therapy: has not participated in   Multimodal medical therapy including regular antiinflammatories: none  Injections: has not received any epidural steroid injections  Past Surgery: denies  Joy Brown has no symptoms of cervical myelopathy.  The symptoms are causing a significant impact on the patient's life.   I have utilized the care everywhere function in epic to review the outside records available from external health systems.  Review of Systems:  A 10 point review of systems is negative, except for the pertinent positives and negatives detailed in the HPI.  Past Medical  History: Past Medical History:  Diagnosis Date   Anemia    History of chicken pox    Migraines     Past Surgical History: Past Surgical History:  Procedure Laterality Date   ABDOMINAL HYSTERECTOMY     CESAREAN SECTION     COLONOSCOPY WITH PROPOFOL N/A 04/04/2021   Procedure: COLONOSCOPY WITH PROPOFOL;  Surgeon: Lesly Rubenstein, MD;  Location: [facility] ENDOSCOPY;  Service: Endoscopy;  Laterality: N/A;   DIAGNOSTIC LAPAROSCOPY     JOINT REPLACEMENT     KNEE ASL RECONSTRUCTION Left    NOSE SURGERY     TUBAL LIGATION     WISDOM TEETH REMOVAL      Allergies: Allergies as of 03/28/2022 - Review Complete 03/28/2022  Allergen Reaction Noted   Latex  02/17/2017    Medications: No outpatient medications have been marked as taking for the 03/28/22 encounter (Office Visit) with Meade Maw, MD.    Social History: Social History   Tobacco Use   Smoking status: Never   Smokeless tobacco: Never  Vaping Use   Vaping Use: Never used  Substance Use Topics   Alcohol use: Yes    Comment: occasional   Drug use: No    Family Medical History: Family History  Problem Relation Age of Onset   Cancer Father        non hodginks lymphoma   Cancer Maternal Grandmother        vulva   Breast cancer Neg Hx     Physical Examination: There were  no vitals filed for this visit.  General: Patient is well developed, well nourished, calm, collected, and in no apparent distress. Attention to examination is appropriate.  Neck:   Supple.  Limited range of motion on extension and rotation with discomfort in all 3 of those directions.  She is more limited on rotation to the right than left.  Respiratory: Patient is breathing without any difficulty.   NEUROLOGICAL:     Awake, alert, oriented to person, place, and time.  Speech is clear and fluent.   Cranial Nerves: Pupils equal round and reactive to light.  Facial tone is symmetric.  Facial sensation is symmetric. Shoulder shrug is  symmetric. Tongue protrusion is midline.  There is no pronator drift.  ROM of spine: full.    Strength: Side Biceps Triceps Deltoid Interossei Grip Wrist Ext. Wrist Flex.  R 5 5 5 5 5 5 5   L 5 5 5 5 5 5 5    Side Iliopsoas Quads Hamstring PF DF EHL  R 5 5 5 5 5 5   L 5 5 5 5 5 5    Reflexes are 1+ and symmetric at the biceps, triceps, brachioradialis, patella and achilles.   Hoffman's is absent.   Bilateral upper and lower extremity sensation is intact to light touch.    No evidence of dysmetria noted.  Gait is normal.     Medical Decision Making  Imaging: MRI C spine 03/08/2022 IMPRESSION: 1. 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 2. 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 3. 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 No syrinx. 4. 1.5 cm right thyroid nodule, indeterminate. Further evaluation with dedicated thyroid ultrasound recommended (ref: J Am Coll Radiol. 2015 Feb;12(2): 143-50).     Electronically Signed   By: Jeannine Boga M.D.   On: 03/09/2022 05:09  I have personally reviewed the images and agree with the above interpretation.  EMG 02/14/2022 Impression: Normal study. There is no electrodiagnostic evidence of a large fiber neuropathy in the left upper extremity.   Thank you for the referral of this patient. It was our privilege to participate in care of your patient. Feel free to contact us with any further questions.  _____________________________ Gurney Maxin, M.D.   Assessment and Plan: [name] is a pleasant 47 y.o. female with neck pain, possible cervical radicular pain, and an incidental thyroid nodule.  She has an asymptomatic Chiari malformation.  I would not recommend intervention on her Chiari malformation.  For her neck pain and cervical  radiculopathy, I recommended physical therapy and an injection.  I will refer her for these.  For the thyroid nodule, I will follow the current radiology guidelines which are noted in her MRI report and we will order an ultrasound of her thyroid.  Further management will be dictated by the findings of this study.  I did report to her that I do not do thyroid surgery and will follow radiology recommendations at this time and then make referrals as appropriate.  I spent a total of 30 minutes in this patient's care today. This time was spent reviewing pertinent records including imaging studies, obtaining and confirming history, performing a directed evaluation, formulating and discussing my recommendations, and documenting the visit within the medical record.      Thank you for involving me in the care of this patient.  Eliabeth Shoff K. Izora Ribas MD, [facility] Neurosurgery

## 2022-04-17 ENCOUNTER — Ambulatory Visit
Admission: RE | Admit: 2022-04-17 | Discharge: 2022-04-17 | Disposition: A | Discharge status: Home / Self Care | Payer: BC Managed Care – PPO | Source: Ambulatory Visit | Attending: Neurosurgery | Admitting: Neurosurgery

## 2022-04-17 DIAGNOSIS — E041 Nontoxic single thyroid nodule: Secondary | ICD-10-CM | POA: Insufficient documentation

## 2022-04-19 ENCOUNTER — Telehealth: Payer: Self-pay

## 2022-04-19 NOTE — Telephone Encounter (Signed)
-----   Message from [place] sent at 04/19/2022  9:35 AM EST ----- Regarding: Ultrasound of thyroid Dr. Darreld Mclean sent her to get an ultrasound of her thyroid. She was instructed to wait 24hrs after her ultrasound to report back.  "For the thyroid nodule, I will follow the current radiology guidelines which are noted in her MRI report and we will order an ultrasound of her thyroid.  Further management will be dictated by the findings of this study.  I did report to her that I do not do thyroid surgery and will follow radiology recommendations at this time and then make referrals as appropriate." (From visit on 03/28/22)  Did not know if we needed to see her or just let her know of any referrals we were sending out for her.

## 2022-04-21 ENCOUNTER — Other Ambulatory Visit: Payer: Self-pay | Admitting: Neurosurgery

## 2022-04-21 DIAGNOSIS — E041 Nontoxic single thyroid nodule: Secondary | ICD-10-CM

## 2022-04-21 NOTE — Telephone Encounter (Signed)
Loleta Dicker, PA at 04/21/2022 10:31 AM  Status: Signed  Ordered FNA and referral to ENT

## 2022-04-21 NOTE — Progress Notes (Signed)
Ordered FNA and referral to ENT

## 2022-04-21 NOTE — Telephone Encounter (Signed)
Joy Brown spoke with the patient.

## 2022-04-28 NOTE — Progress Notes (Signed)
Patient for US guided FNA RT Inferior Thyroid Nodule Biopsy on Monday 05/01/2022, I called and spoke with the patient on the phone and gave pre-procedure instructions. Pt was made aware to be here at 12:30p at the [facility] and check in at the registration desk. Pt stated understanding.  Called 04/26/2022

## 2022-05-01 ENCOUNTER — Ambulatory Visit
Admission: RE | Admit: 2022-05-01 | Discharge: 2022-05-01 | Disposition: A | Discharge status: Home / Self Care | Payer: BC Managed Care – PPO | Source: Ambulatory Visit | Attending: Neurosurgery | Admitting: Neurosurgery

## 2022-05-01 DIAGNOSIS — E041 Nontoxic single thyroid nodule: Secondary | ICD-10-CM | POA: Diagnosis not present

## 2022-05-01 MED ORDER — LIDOCAINE HCL (PF) 1 % IJ SOLN
5.0000 mL | Freq: Once | INTRAMUSCULAR | Status: AC
  Administered 2022-05-01: 5 mL via INTRADERMAL

## 2022-05-01 MED ORDER — LIDOCAINE HCL (PF) 2 % IJ SOLN: 5.0000 mL | Freq: Once | INTRAMUSCULAR | Status: DC

## 2022-05-01 NOTE — Discharge Instructions (Signed)
Discharge instructions reviewed with patient.

## 2022-05-04 LAB — CYTOLOGY - NON PAP

## 2022-05-15 ENCOUNTER — Other Ambulatory Visit: Payer: Self-pay | Admitting: Unknown Physician Specialty

## 2022-05-15 DIAGNOSIS — E041 Nontoxic single thyroid nodule: Secondary | ICD-10-CM

## 2022-05-23 NOTE — Progress Notes (Signed)
Patient for US guided FNA RT Inferior Thyroid Nodule Biopsy on Wed 05/24/2022, I called and spoke with the patient on the phone and gave pre-procedure instructions. Pt was made aware to be here at 12:30p, NPO after MN prior to procedure as well as driver post procedure/recovery/discharge. MD will be doing her procedure and this procedure will be done under moderate sedation.  Pt stated understanding.  Called 05/23/2022

## 2022-05-24 ENCOUNTER — Other Ambulatory Visit: Payer: Self-pay

## 2022-05-24 ENCOUNTER — Other Ambulatory Visit: Payer: Self-pay | Admitting: Student

## 2022-05-24 ENCOUNTER — Ambulatory Visit
Admission: RE | Admit: 2022-05-24 | Discharge: 2022-05-24 | Disposition: A | Discharge status: Home / Self Care | Payer: BC Managed Care – PPO | Source: Ambulatory Visit | Attending: Unknown Physician Specialty | Admitting: Unknown Physician Specialty

## 2022-05-24 DIAGNOSIS — E041 Nontoxic single thyroid nodule: Secondary | ICD-10-CM | POA: Insufficient documentation

## 2022-05-24 MED ORDER — MIDAZOLAM HCL 2 MG/2ML IJ SOLN
INTRAMUSCULAR | Status: AC
  Filled 2022-05-24: qty 2

## 2022-05-24 MED ORDER — LIDOCAINE HCL (PF) 1 % IJ SOLN
10.0000 mL | Freq: Once | INTRAMUSCULAR | Status: AC
  Administered 2022-05-24: 10 mL via INTRADERMAL
  Filled 2022-05-24: qty 10

## 2022-05-24 MED ORDER — MIDAZOLAM HCL 2 MG/2ML IJ SOLN
INTRAMUSCULAR | Status: AC | PRN
  Administered 2022-05-24: 1 mg via INTRAVENOUS

## 2022-05-24 MED ORDER — FENTANYL CITRATE (PF) 100 MCG/2ML IJ SOLN
INTRAMUSCULAR | Status: AC | PRN
  Administered 2022-05-24: 50 ug via INTRAVENOUS

## 2022-05-24 MED ORDER — FENTANYL CITRATE (PF) 100 MCG/2ML IJ SOLN
INTRAMUSCULAR | Status: AC
  Filled 2022-05-24: qty 2

## 2022-05-24 MED ORDER — SODIUM CHLORIDE 0.9 % IV SOLN: INTRAVENOUS | Status: DC

## 2022-05-24 NOTE — Procedures (Addendum)
Interventional Radiology Procedure Note  Procedure: US guided FNA of right thyroid nodule Moderate sedation Findings:  Mx passes required (~10, for mostly bloody nodule).  Cyto tech reports adequate sample, with molecular testing samples.  Complications: None EBL: None Recommendations: - Bedrest 1 hours.   - Routine wound care - Follow up pathology - Advance diet   Signed,  Corrie Mckusick, DO

## 2022-05-24 NOTE — H&P (Signed)
Chief Complaint: Patient was seen in consultation today for indeterminate thyroid nodule  Referring Physician(s): Preston  Supervising Physician: Corrie Mckusick  Patient Status: [facility] - Out-pt  History of Present Illness: Joy Brown is a 47 y.o. female with PMH significant for anemia and migraines being seen today in relation to a right inferior thyroid nodule of indeterminate significance. Patient had thyroid ultrasound on 04/17/22 which revealed presence of a TI-RADS 4 nodule in the right inferior thyroid. Patient previously underwent image-guided thyroid biopsy on 05/01/22 that was non-diagnostic. Patient presents today for repeat image-guided thyroid biopsy under moderate sedation with Dr Earleen Newport.  Past Medical History:  Diagnosis Date   Anemia    History of chicken pox    Migraines     Past Surgical History:  Procedure Laterality Date   ABDOMINAL HYSTERECTOMY     CESAREAN SECTION     COLONOSCOPY WITH PROPOFOL N/A 04/04/2021   Procedure: COLONOSCOPY WITH PROPOFOL;  Surgeon: Lesly Rubenstein, MD;  Location: [facility] ENDOSCOPY;  Service: Endoscopy;  Laterality: N/A;   DIAGNOSTIC LAPAROSCOPY     JOINT REPLACEMENT     KNEE ASL RECONSTRUCTION Left    NOSE SURGERY     TUBAL LIGATION     WISDOM TEETH REMOVAL      Allergies: Latex  Medications: Prior to Admission medications   Medication Sig Start Date End Date Taking? Authorizing Provider  clobetasol ointment (TEMOVATE) [id number] % Apply 1 application topically 2 (two) times a week.   Yes [provider]  DOTTI 0.025 MG/24HR Place onto the skin. 11/25/21  Yes [provider]  escitalopram (LEXAPRO) 10 MG tablet Take 10 mg by mouth daily. 12/07/21  Yes [provider]  gabapentin (NEURONTIN) 300 MG capsule Take 300 mg by mouth daily.   Yes [provider]  nortriptyline (PAMELOR) 10 MG capsule TAKE 20-30 MG AT NIGHT 11/01/16  Yes [provider]  promethazine (PHENERGAN) 25 MG  tablet Take 1 tablet (25 mg total) by mouth every 8 (eight) hours as needed for refractory nausea / vomiting. 10/09/20   Coral Spikes, DO  rizatriptan (MAXALT) 10 MG tablet Take by mouth. 08/30/20   [provider]  SUMAtriptan (IMITREX) 100 MG tablet Take 1/2-1 tab at headache onset, can repeat once in two hours if needed.  No more than 2 tabs in 24 hours 02/26/17   [provider]     Family History  Problem Relation Age of Onset   Cancer Father        non hodginks lymphoma   Cancer Maternal Grandmother        vulva   Breast cancer Neg Hx     Social History   Socioeconomic History   Marital status: Married    Spouse name: Not on file   Number of children: Not on file   Years of education: Not on file   Highest education level: Not on file  Occupational History   Not on file  Tobacco Use   Smoking status: Never   Smokeless tobacco: Never  Vaping Use   Vaping Use: Never used  Substance and Sexual Activity   Alcohol use: Yes    Comment: occasional   Drug use: No   Sexual activity: Not on file  Other Topics Concern   Not on file  Social History Narrative   Not on file   Social Determinants of Health   Financial Resource Strain: Not on file  Food Insecurity: Not on file  Transportation Needs:  Not on file  Physical Activity: Not on file  Stress: Not on file  Social Connections: Not on file    Code Status: Full code  Review of Systems: A 12 point ROS discussed and pertinent positives are indicated in the HPI above.  All other systems are negative.  Review of Systems  Constitutional:  Negative for chills, fatigue and fever.  Respiratory:  Negative for chest tightness and shortness of breath.   Cardiovascular:  Negative for chest pain and leg swelling.  Gastrointestinal:  Negative for abdominal pain, diarrhea, nausea and vomiting.  Neurological:  Negative for dizziness and headaches.  Psychiatric/Behavioral:  Negative for confusion.     Vital  Signs: BP 134/88   Pulse 85   Temp 98 F (36.7 C) (Oral)   Resp 18   Ht '5\' 5"'$  (1.651 m)   Wt 155 lb (70.3 kg)   SpO2 98%   BMI 25.79 kg/m     Physical Exam Vitals reviewed.  Constitutional:      General: She is not in acute distress.    Appearance: She is not ill-appearing.  HENT:     Mouth/Throat:     Mouth: Mucous membranes are moist.  Eyes:     Pupils: Pupils are equal, round, and reactive to light.  Cardiovascular:     Rate and Rhythm: Normal rate and regular rhythm.     Pulses: Normal pulses.     Heart sounds: Normal heart sounds.  Pulmonary:     Effort: Pulmonary effort is normal.     Breath sounds: Normal breath sounds.  Abdominal:     General: Bowel sounds are normal.     Palpations: Abdomen is soft.     Tenderness: There is no abdominal tenderness.  Musculoskeletal:     Right lower leg: No edema.     Left lower leg: No edema.  Skin:    General: Skin is warm and dry.  Neurological:     Mental Status: She is alert and oriented to person, place, and time.  Psychiatric:        Mood and Affect: Mood normal.        Behavior: Behavior normal.        Thought Content: Thought content normal.        Judgment: Judgment normal.     Imaging: [place] FNA BX THYROID 1ST LESION AFIRMA  Result Date: 05/01/2022 INDICATION: Indeterminate thyroid nodule EXAM: ULTRASOUND GUIDED FINE NEEDLE ASPIRATION OF INDETERMINATE THYROID NODULE COMPARISON:  Ultrasound thyroid 04/17/2022 MEDICATIONS: 5 cc 1% lidocaine COMPLICATIONS: None immediate. TECHNIQUE: Informed written consent was obtained from the patient after a discussion of the risks, benefits and alternatives to treatment. Questions regarding the procedure were encouraged and answered. A timeout was performed prior to the initiation of the procedure. Pre-procedural ultrasound scanning demonstrated unchanged size and appearance of the indeterminate nodule within the right inferior thyroid The procedure was planned. The neck was  prepped in the usual sterile fashion, and a sterile drape was applied covering the operative field. A timeout was performed prior to the initiation of the procedure. Local anesthesia was provided with 1% lidocaine. Under direct ultrasound guidance, 10 FNA passes were performed of the nodule with a 25 gauge needle. Secondary to inadequacy of sampling, as conveyed by the cytotechnologist, additional passes were performed. Under direct ultrasound guidance, 2 additional FNA biopsies were performed of the nodule with an 18 gauge needle. Multiple ultrasound images were saved for procedural documentation purposes. The samples were prepared and submitted to  pathology. One of these samples was prepared in Afirma for molecular analysis. Limited post procedural scanning was negative for hematoma or additional complication. Dressings were placed. The patient tolerated the above procedures procedure well without immediate postprocedural complication. FINDINGS: Nodule reference number based on prior diagnostic ultrasound: 1 Maximum size: 2.4 cm Location: Right; Inferior ACR TI-RADS risk category: TR4 (4-6 points) Reason for biopsy: meets ACR TI-RADS criteria Ultrasound imaging confirms appropriate placement of the needles within the thyroid nodule. IMPRESSION: Successful ultrasound guided FNA biopsy of 2.4 cm RIGHT inferior TR-4 thyroid nodule Procedure performed by Reatha Armour, IR PA and supervised by Michaelle Birks, MD Electronically Signed   By: Michaelle Birks M.D.   On: 05/01/2022 14:43    Labs:  CBC: No results for input(s): "WBC", "HGB", "HCT", "PLT" in the last 8760 hours.  COAGS: No results for input(s): "INR", "APTT" in the last 8760 hours.  BMP: No results for input(s): "NA", "K", "CL", "CO2", "GLUCOSE", "BUN", "CALCIUM", "CREATININE", "GFRNONAA", "GFRAA" in the last 8760 hours.  Invalid input(s): "CMP"  LIVER FUNCTION TESTS: No results for input(s): "BILITOT", "AST", "ALT", "ALKPHOS", "PROT", "ALBUMIN" in  the last 8760 hours.  TUMOR MARKERS: No results for input(s): "AFPTM", "CEA", "CA199", "CHROMGRNA" in the last 8760 hours.  Assessment and Plan:  Joy Brown is a 47 yo female being seen today in relation to a thyroid nodule of indeterminate significance. The patient presents today in her usual state of health. She reports feeling mildly anxious regarding her procedure today. Case was reviewed and approved by Dr Denna Haggard. Case is set to proceed on 05/24/22 with Dr Earleen Newport.  Risks and benefits of image-guided thyroid was discussed with the patient and/or patient's family including, but not limited to bleeding, infection, damage to adjacent structures or low yield requiring additional tests.  All of the questions were answered and there is agreement to proceed.  Consent signed and in chart.   Thank you for this interesting consult.  I greatly enjoyed meeting [employer] and look forward to participating in their care.  A copy of this report was sent to the requesting provider on this date.  Electronically Signed: Lura Em, [place] 05/24/2022, 1:11 PM   I spent a total of   25 Minutes in face to face in clinical consultation, greater than 50% of which was counseling/coordinating care for indeterminate thyroid nodule.

## 2022-05-25 ENCOUNTER — Ambulatory Visit (INDEPENDENT_AMBULATORY_CARE_PROVIDER_SITE_OTHER): Payer: BC Managed Care – PPO | Admitting: Neurosurgery

## 2022-05-25 ENCOUNTER — Encounter: Payer: Self-pay | Admitting: Neurosurgery

## 2022-05-25 VITALS — BP 128/82 | Ht 65.0 in | Wt 155.0 lb

## 2022-05-25 DIAGNOSIS — M5412 Radiculopathy, cervical region: Secondary | ICD-10-CM | POA: Diagnosis not present

## 2022-05-25 LAB — CYTOLOGY - NON PAP

## 2022-05-25 NOTE — Progress Notes (Signed)
Referring Physician:  Baxter Hire, MD [place],  [place] [address]  Primary Physician:  Baxter Hire, MD  History of Present Illness: 05/25/2022 [name] is hurting from multiple biopsies of her thyroid gland.  Her neck pain is overall a little bit better.  She has had an epidural steroid injection which has helped her arm pain.  She had a trigger point injection on Monday which helped.  She feels that she is making forward progress.  03/28/2022 [name] is here today with a chief complaint of neck pain that radiates into the right arm and numbness and tingling in the left arm/hand. In October 2023 she started noticing that she was dropping things.   She was seen by Dr. Lacinda Axon on 10/26/20 for Chiari Malformation and she was not interested in surgery, so he told her to follow up as needed.  Similar to her presentation at that time, she does not have any clearly Chiari related headaches at this time.  She has a headache disorder, but it is more consistent with migraine headaches.  She does not have headaches with sneezing or coughing.   She reports 7 to 8 months of numbness and tingling in her left greater than right arm.  She gets pain into her trapezius muscle on the right side particular when she rotates her head to the left.  She has numbness in her third and fourth fingers.  She has been dropping items at times.  She has limited range of motion which causes pain in her neck.  She did have a left-sided carpal tunnel injection which did help her symptoms.  Bowel/Bladder Dysfunction: none  Conservative measures: EMG on 02/14/22 Physical therapy: has not participated in   Multimodal medical therapy including regular antiinflammatories: none  Injections: has not received any epidural steroid injections  Past Surgery: denies  Joy Brown has no symptoms of cervical myelopathy.  The symptoms are causing a significant impact on the patient's life.   I  have utilized the care everywhere function in epic to review the outside records available from external health systems.  Review of Systems:  A 10 point review of systems is negative, except for the pertinent positives and negatives detailed in the HPI.  Past Medical History: Past Medical History:  Diagnosis Date   Anemia    History of chicken pox    Migraines     Past Surgical History: Past Surgical History:  Procedure Laterality Date   ABDOMINAL HYSTERECTOMY     CESAREAN SECTION     COLONOSCOPY WITH PROPOFOL N/A 04/04/2021   Procedure: COLONOSCOPY WITH PROPOFOL;  Surgeon: Lesly Rubenstein, MD;  Location: [facility] ENDOSCOPY;  Service: Endoscopy;  Laterality: N/A;   DIAGNOSTIC LAPAROSCOPY     JOINT REPLACEMENT     KNEE ASL RECONSTRUCTION Left    NOSE SURGERY     TUBAL LIGATION     WISDOM TEETH REMOVAL      Allergies: Allergies as of 05/25/2022 - Review Complete 05/25/2022  Allergen Reaction Noted   Latex  02/17/2017    Medications: Current Meds  Medication Sig   clobetasol ointment (TEMOVATE) [id number] % Apply 1 application topically 2 (two) times a week.   DOTTI 0.025 MG/24HR Place onto the skin.   escitalopram (LEXAPRO) 10 MG tablet Take 10 mg by mouth daily.   gabapentin (NEURONTIN) 300 MG capsule Take 300 mg by mouth daily.   nortriptyline (PAMELOR) 10 MG capsule TAKE 20-30 MG AT NIGHT  Social History: Social History   Tobacco Use   Smoking status: Never   Smokeless tobacco: Never  Vaping Use   Vaping Use: Never used  Substance Use Topics   Alcohol use: Yes    Comment: occasional   Drug use: No    Family Medical History: Family History  Problem Relation Age of Onset   Cancer Father        non hodginks lymphoma   Cancer Maternal Grandmother        vulva   Breast cancer Neg Hx     Physical Examination: Vitals:   05/25/22 0927  BP: 128/82    General: Patient is well developed, well nourished, calm, collected, and in no apparent distress. Attention  to examination is appropriate.  Neck:   Supple.  Limited range of motion on extension and rotation with discomfort in all 3 of those directions.  She is more limited on rotation to the right than left.  Respiratory: Patient is breathing without any difficulty.   NEUROLOGICAL:     Awake, alert, oriented to person, place, and time.  Speech is clear and fluent.   Cranial Nerves: Pupils equal round and reactive to light.  Facial tone is symmetric.  Facial sensation is symmetric. Shoulder shrug is symmetric. Tongue protrusion is midline.  There is no pronator drift.  ROM of spine: full.    Strength: Side Biceps Triceps Deltoid Interossei Grip Wrist Ext. Wrist Flex.  R '5 5 5 5 5 5 5  '$ L '5 5 5 5 5 5 5   '$ Side Iliopsoas Quads Hamstring PF DF EHL  R '5 5 5 5 5 5  '$ L '5 5 5 5 5 5   '$ Reflexes are 1+ and symmetric at the biceps, triceps, brachioradialis, patella and achilles.   Hoffman's is absent.   Bilateral upper and lower extremity sensation is intact to light touch.    No evidence of dysmetria noted.  Gait is normal.     Medical Decision Making  Imaging: MRI C spine 03/08/2022 IMPRESSION: 1. 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 2. 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 3. 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 No syrinx. 4. 1.5 cm right thyroid nodule, indeterminate. Further evaluation with dedicated thyroid ultrasound recommended (ref: J Am Coll Radiol. 2015 Feb;12(2): 143-50).     Electronically Signed   By: Jeannine Boga M.D.   On: 03/09/2022 05:09  I have personally reviewed the images and agree with the above interpretation.  EMG 02/14/2022 Impression: Normal study. There is no electrodiagnostic evidence of a large fiber neuropathy in the left upper extremity.    Thank you for the referral of this patient. It was our privilege to participate in care of your patient. Feel free to contact us with any further questions.  _____________________________ Gurney Maxin, M.D.   Assessment and Plan: [name] is a pleasant 47 y.o. female with neck pain, possible cervical radicular pain.  Her neck pain and radicular symptoms have improved since I last saw her.  She will continue physical therapy for now.  If she would like to proceed with another epidural steroid injection, that would be okay with me.  She is working with [facility] physiatry with regard to epidural and trigger point injections.  I will see her back in 2 months for follow-up.  I spent  a total of 10 minutes in this patient's care today. This time was spent reviewing pertinent records including imaging studies, obtaining and confirming history, performing a directed evaluation, formulating and discussing my recommendations, and documenting the visit within the medical record.      Thank you for involving me in the care of this patient.      Garlene Apperson K. Izora Ribas MD, [facility] Neurosurgery

## 2022-05-26 ENCOUNTER — Other Ambulatory Visit: Payer: Self-pay | Admitting: Unknown Physician Specialty

## 2022-05-26 DIAGNOSIS — E041 Nontoxic single thyroid nodule: Secondary | ICD-10-CM

## 2022-07-25 ENCOUNTER — Encounter: Payer: Self-pay | Admitting: Neurosurgery

## 2022-07-25 ENCOUNTER — Ambulatory Visit (INDEPENDENT_AMBULATORY_CARE_PROVIDER_SITE_OTHER): Payer: BC Managed Care – PPO | Admitting: Neurosurgery

## 2022-07-25 VITALS — BP 110/78 | Ht 65.0 in | Wt 160.0 lb

## 2022-07-25 DIAGNOSIS — M5412 Radiculopathy, cervical region: Secondary | ICD-10-CM | POA: Diagnosis not present

## 2022-07-25 NOTE — Progress Notes (Signed)
Referring Physician:  Gracelyn Nurse, MD [address] [place],  [place] [address]  Primary Physician:  Gracelyn Nurse, MD  History of Present Illness: 07/25/2022 She has been doing physical therapy and had an injection.  She is feeling much better.  She has no pain.  She did get Achilles tendinitis in her right Achilles tendon, but is being treated as an outpatient for this.  05/25/2022 Ms. Brumlow is hurting from multiple biopsies of her thyroid gland.  Her neck pain is overall a little bit better.  She has had an epidural steroid injection which has helped her arm pain.  She had a trigger point injection on Monday which helped.  She feels that she is making forward progress.  03/28/2022 Ms. Joreen Goodlow is here today with a chief complaint of neck pain that radiates into the right arm and numbness and tingling in the left arm/hand. In October 2023 she started noticing that she was dropping things.   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  Similar to her presentation at that time, she does not have any clearly Chiari related headaches at this time.  She has a headache disorder, but it is more consistent with migraine headaches.  She does not have headaches with sneezing or coughing.   She reports 7 to 8 months of numbness and tingling in her left greater than right arm.  She gets pain into her trapezius muscle on the right side particular when she rotates her head to the left.  She has numbness in her third and fourth fingers.  She has been dropping items at times.  She has limited range of motion which causes pain in her neck.  She did have a left-sided carpal tunnel injection which did help her symptoms.  Bowel/Bladder Dysfunction: none  Conservative measures: EMG on 02/14/22 Physical therapy: has not participated in   Multimodal medical therapy including regular  antiinflammatories: none  Injections: has not received any epidural steroid injections  Past Surgery: denies  Mischell ARIANI MCDARIS has no symptoms of cervical myelopathy.  The symptoms are causing a significant impact on the patient's life.   I have utilized the care everywhere function in epic to review the outside records available from external health systems.  Review of Systems:  A 10 point review of systems is negative, except for the pertinent positives and negatives detailed in the HPI.  Past Medical History: Past Medical History:  Diagnosis Date   Anemia    History of chicken pox    Migraines     Past Surgical History: Past Surgical History:  Procedure Laterality Date   ABDOMINAL HYSTERECTOMY     CESAREAN SECTION     COLONOSCOPY WITH PROPOFOL N/A 04/04/2021   Procedure: COLONOSCOPY WITH PROPOFOL;  Surgeon: Regis Bill, MD;  Location: [facility] ENDOSCOPY;  Service: Endoscopy;  Laterality: N/A;   DIAGNOSTIC LAPAROSCOPY     JOINT REPLACEMENT     KNEE ASL RECONSTRUCTION Left    NOSE SURGERY     TUBAL LIGATION     WISDOM TEETH REMOVAL      Allergies: Allergies as of 07/25/2022 - Review Complete 07/25/2022  Allergen Reaction Noted   Latex  02/17/2017    Medications: Current Meds  Medication Sig   methylPREDNISolone (MEDROL DOSEPAK) 4 MG TBPK tablet Take by mouth as directed.   MOBIC 7.5 MG tablet  Take 7.5 mg by mouth daily.    Social History: Social History   Tobacco Use   Smoking status: Never   Smokeless tobacco: Never  Vaping Use   Vaping Use: Never used  Substance Use Topics   Alcohol use: Yes    Comment: occasional   Drug use: No    Family Medical History: Family History  Problem Relation Age of Onset   Cancer Father        non hodginks lymphoma   Cancer Maternal Grandmother        vulva   Breast cancer Neg Hx     Physical Examination: Vitals:   07/25/22 1008  BP: 110/78    General: Patient is well developed, well nourished, calm,  collected, and in no apparent distress. Attention to examination is appropriate.  Neck:   Supple.  Limited range of motion on extension and rotation with discomfort in all 3 of those directions.  She is more limited on rotation to the right than left.  Respiratory: Patient is breathing without any difficulty.   NEUROLOGICAL:     Awake, alert, oriented to person, place, and time.  Speech is clear and fluent.   Cranial Nerves: Pupils equal round and reactive to light.  Facial tone is symmetric.  Facial sensation is symmetric. Shoulder shrug is symmetric. Tongue protrusion is midline.  There is no pronator drift.  ROM of spine: full.    Strength: Side Biceps Triceps Deltoid Interossei Grip Wrist Ext. Wrist Flex.  R 5 5 5 5 5 5 5   L 5 5 5 5 5 5 5    Bilateral upper and lower extremity sensation is intact to light touch.    No evidence of dysmetria noted.  Gait is normal.     Medical Decision Making  Imaging: MRI C spine 03/08/2022 IMPRESSION: 1. 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 2. 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 3. 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 No syrinx. 4. 1.5 cm right thyroid nodule, indeterminate. Further evaluation with dedicated thyroid ultrasound recommended (ref: J Am Coll Radiol. 2015 Feb;12(2): 143-50).     Electronically Signed   By: Rise Mu M.D.   On: 03/09/2022 05:09  I have personally reviewed the images and agree with the above interpretation.  EMG 02/14/2022 Impression: Normal study. There is no electrodiagnostic evidence of a large fiber neuropathy in the left upper extremity.   Thank you for the referral of this patient. It was our privilege to participate in care of your patient. Feel free to contact us with any  further questions.  _____________________________ Theora Master, M.D.   Assessment and Plan: Ms. Frink is a pleasant 47 y.o. female with resolution of her neck and radicular pain.  She is feeling much better.  She has been discharged from physical therapy.  Will see her back on an as-needed basis.  Will reevaluate if her pain returns.   I spent a total of 10 minutes in this patient's care today. This time was spent reviewing pertinent records including imaging studies, obtaining and confirming history, performing a directed evaluation, formulating and discussing my recommendations, and documenting the visit within the medical record.      Thank you for involving me in the care of this patient.      Legend Pecore K. Myer Haff MD, [facility]surgery

## 2022-08-01 ENCOUNTER — Other Ambulatory Visit: Payer: Self-pay | Admitting: Internal Medicine

## 2022-08-01 DIAGNOSIS — Z1231 Encounter for screening mammogram for malignant neoplasm of breast: Secondary | ICD-10-CM

## 2022-09-19 ENCOUNTER — Ambulatory Visit
Admission: RE | Admit: 2022-09-19 | Discharge: 2022-09-19 | Disposition: A | Discharge status: Home / Self Care | Payer: BC Managed Care – PPO | Source: Ambulatory Visit | Attending: Internal Medicine | Admitting: Internal Medicine

## 2022-09-19 DIAGNOSIS — Z1231 Encounter for screening mammogram for malignant neoplasm of breast: Secondary | ICD-10-CM | POA: Diagnosis present

## 2022-09-22 ENCOUNTER — Other Ambulatory Visit: Payer: Self-pay | Admitting: Internal Medicine

## 2022-09-22 DIAGNOSIS — R928 Other abnormal and inconclusive findings on diagnostic imaging of breast: Secondary | ICD-10-CM

## 2022-09-22 DIAGNOSIS — N6312 Unspecified lump in the right breast, upper inner quadrant: Secondary | ICD-10-CM

## 2022-09-27 ENCOUNTER — Ambulatory Visit
Admission: RE | Admit: 2022-09-27 | Discharge: 2022-09-27 | Disposition: A | Discharge status: Home / Self Care | Payer: BC Managed Care – PPO | Source: Ambulatory Visit | Attending: Internal Medicine | Admitting: Internal Medicine

## 2022-09-27 DIAGNOSIS — N6312 Unspecified lump in the right breast, upper inner quadrant: Secondary | ICD-10-CM

## 2022-09-27 DIAGNOSIS — R928 Other abnormal and inconclusive findings on diagnostic imaging of breast: Secondary | ICD-10-CM

## 2022-10-02 ENCOUNTER — Other Ambulatory Visit: Payer: Self-pay | Admitting: Internal Medicine

## 2022-10-02 DIAGNOSIS — R928 Other abnormal and inconclusive findings on diagnostic imaging of breast: Secondary | ICD-10-CM

## 2022-10-02 DIAGNOSIS — N63 Unspecified lump in unspecified breast: Secondary | ICD-10-CM

## 2022-10-12 ENCOUNTER — Ambulatory Visit
Admission: RE | Admit: 2022-10-12 | Discharge: 2022-10-12 | Disposition: A | Discharge status: Home / Self Care | Payer: BC Managed Care – PPO | Source: Ambulatory Visit | Attending: Internal Medicine | Admitting: Internal Medicine

## 2022-10-12 ENCOUNTER — Other Ambulatory Visit: Payer: Self-pay | Admitting: Internal Medicine

## 2022-10-12 DIAGNOSIS — N63 Unspecified lump in unspecified breast: Secondary | ICD-10-CM | POA: Insufficient documentation

## 2022-10-12 DIAGNOSIS — R928 Other abnormal and inconclusive findings on diagnostic imaging of breast: Secondary | ICD-10-CM | POA: Diagnosis present

## 2022-10-12 HISTORY — PX: BREAST BIOPSY: SHX20

## 2022-10-12 MED ORDER — LIDOCAINE HCL 1 % IJ SOLN
4.0000 mL | Freq: Once | INTRAMUSCULAR | Status: AC
  Administered 2022-10-12: 4 mL

## 2022-10-12 MED ORDER — LIDOCAINE-EPINEPHRINE 1 %-1:100000 IJ SOLN
5.0000 mL | Freq: Once | INTRAMUSCULAR | Status: AC
  Administered 2022-10-12: 5 mL

## 2022-11-23 ENCOUNTER — Encounter: Payer: Self-pay | Admitting: Neurosurgery

## 2022-11-23 NOTE — Telephone Encounter (Signed)
She confirmed 12/05/2022

## 2022-12-05 ENCOUNTER — Encounter: Payer: Self-pay | Admitting: Neurosurgery

## 2022-12-05 ENCOUNTER — Ambulatory Visit (INDEPENDENT_AMBULATORY_CARE_PROVIDER_SITE_OTHER): Payer: BC Managed Care – PPO | Admitting: Neurosurgery

## 2022-12-05 VITALS — BP 116/76 | Ht 65.0 in | Wt 160.0 lb

## 2022-12-05 DIAGNOSIS — R202 Paresthesia of skin: Secondary | ICD-10-CM

## 2022-12-05 DIAGNOSIS — M5412 Radiculopathy, cervical region: Secondary | ICD-10-CM

## 2022-12-05 DIAGNOSIS — M4802 Spinal stenosis, cervical region: Secondary | ICD-10-CM

## 2022-12-05 DIAGNOSIS — R200 Anesthesia of skin: Secondary | ICD-10-CM

## 2022-12-05 DIAGNOSIS — M542 Cervicalgia: Secondary | ICD-10-CM

## 2022-12-05 NOTE — Progress Notes (Signed)
Referring Physician:  Denton Lank, FNP [id number] [place],  [place] [address]  Primary Physician:  Gracelyn Nurse, MD  History of Present Illness: 12/05/2022 Joy Brown has developed R body numbness which she noted last month.  She was noted to have low B12, and was started on supplementation.  She still has numbness on the right side of her body from her chest down to her leg.  She continues to have some neck pain.  She is having some pain into her shoulder blades bilaterally.  She does not have any pain below her elbows.     07/25/2022 She has been doing physical therapy and had an injection.  She is feeling much better.  She has no pain.  She did get Achilles tendinitis in her right Achilles tendon, but is being treated as an outpatient for this.  05/25/2022 Joy Brown is hurting from multiple biopsies of her thyroid gland.  Her neck pain is overall a little bit better.  She has had an epidural steroid injection which has helped her arm pain.  She had a trigger point injection on Monday which helped.  She feels that she is making forward progress.  03/28/2022 Joy Brown is here today with a chief complaint of neck pain that radiates into the right arm and numbness and tingling in the left arm/hand. In October 2023 she started noticing that she was dropping things.   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  Similar to her presentation at that time, she does not have any clearly Chiari related headaches at this time.  She has a headache disorder, but it is more consistent with migraine headaches.  She does not have headaches with sneezing or coughing.   She reports 7 to 8 months of numbness and tingling in her left greater than right arm.  She gets pain into her trapezius muscle on the right side particular when she rotates her head to the left.  She has numbness in her third  and fourth fingers.  She has been dropping items at times.  She has limited range of motion which causes pain in her neck.  She did have a left-sided carpal tunnel injection which did help her symptoms.  Bowel/Bladder Dysfunction: none  Conservative measures: EMG on 02/14/22 Physical therapy: has not participated in   Multimodal medical therapy including regular antiinflammatories: none  Injections: has not received any epidural steroid injections  Past Surgery: denies  Joy Brown has no symptoms of cervical myelopathy.  The symptoms are causing a significant impact on the patient's life.   I have utilized the care everywhere function in epic to review the outside records available from external health systems.  Review of Systems:  A 10 point review of systems is negative, except for the pertinent positives and negatives detailed in the HPI.  Past Medical History: Past Medical History:  Diagnosis Date   Anemia    History of chicken pox    Migraines     Past Surgical History: Past Surgical History:  Procedure Laterality Date   ABDOMINAL HYSTERECTOMY     BREAST BIOPSY Right 10/12/2022   [place] RT BREAST BX W LOC DEV 1ST LESION IMG BX SPEC US GUIDE 10/12/2022 [facility]   CESAREAN SECTION     COLONOSCOPY WITH PROPOFOL N/A 04/04/2021   Procedure: COLONOSCOPY WITH PROPOFOL;  Surgeon:  Regis Bill, MD;  Location: [facility] ENDOSCOPY;  Service: Endoscopy;  Laterality: N/A;   DIAGNOSTIC LAPAROSCOPY     JOINT REPLACEMENT     KNEE ASL RECONSTRUCTION Left    NOSE SURGERY     TUBAL LIGATION     WISDOM TEETH REMOVAL      Allergies: Allergies as of 12/05/2022 - Review Complete 07/25/2022  Allergen Reaction Noted   Latex  02/17/2017    Medications: No outpatient medications have been marked as taking for the 12/05/22 encounter (Appointment) with Venetia Night, MD.    Social History: Social History   Tobacco Use   Smoking status: Never   Smokeless tobacco: Never   Vaping Use   Vaping status: Never Used  Substance Use Topics   Alcohol use: Yes    Comment: occasional   Drug use: No    Family Medical History: Family History  Problem Relation Age of Onset   Cancer Father        non hodginks lymphoma   Cancer Maternal Grandmother        vulva   Breast cancer Neg Hx     Physical Examination: There were no vitals filed for this visit.   General: Patient is well developed, well nourished, calm, collected, and in no apparent distress. Attention to examination is appropriate.  Neck:   Supple.  Limited range of motion on extension and rotation with discomfort in all 3 of those directions.  She is more limited on rotation to the right than left.  Respiratory: Patient is breathing without any difficulty.   NEUROLOGICAL:     Awake, alert, oriented to person, place, and time.  Speech is clear and fluent.   Cranial Nerves: Pupils equal round and reactive to light.  Facial tone is symmetric.  Facial sensation is symmetric. Shoulder shrug is symmetric. Tongue protrusion is midline.  There is no pronator drift.  ROM of spine: full.    Strength: Side Biceps Triceps Deltoid Interossei Grip Wrist Ext. Wrist Flex.  R 5 5 5 5 5 5 5   L 5 5 5 5 5 5 5    Bilateral upper and lower extremity sensation is intact to light touch.    No evidence of dysmetria noted.  Gait is normal.     Medical Decision Making  Imaging: MRI C spine 03/08/2022 IMPRESSION: 1. 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 2. 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 3. 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 No syrinx. 4. 1.5 cm right thyroid nodule, indeterminate. Further evaluation with dedicated thyroid ultrasound recommended (ref: J Am Coll Radiol. 2015 Feb;12(2): 143-50).      Electronically Signed   By: Rise Mu M.D.   On: 03/09/2022 05:09  I have personally reviewed the images and agree with the above interpretation.  EMG 02/14/2022 Impression: Normal study. There is no electrodiagnostic evidence of a large fiber neuropathy in the left upper extremity.   Thank you for the referral of this patient. It was our privilege to participate in care of your patient. Feel free to contact us with any further questions.  _____________________________ Theora Master, M.D.   Assessment and Plan: Joy Brown is a pleasant 47 y.o. female with neck pain, radicular pain, and right sided body numbness.  Is possible that she may have suffered a stroke.  I like to get an  MRI scan of her brain.  Additionally, we will repeat her MRI scan of the cervical spine since her symptoms have changed.  I will see her back afterwards.  I encouraged her to reach out to her neurology provider regarding the time interval to recheck her B12 level.      I spent a total of 10 minutes in this patient's care today. This time was spent reviewing pertinent records including imaging studies, obtaining and confirming history, performing a directed evaluation, formulating and discussing my recommendations, and documenting the visit within the medical record.      Thank you for involving me in the care of this patient.      Aliana Kreischer K. Myer Haff MD, [facility] Neurosurgery

## 2022-12-19 ENCOUNTER — Ambulatory Visit
Admission: RE | Admit: 2022-12-19 | Discharge: 2022-12-19 | Disposition: A | Discharge status: Home / Self Care | Payer: BC Managed Care – PPO | Source: Ambulatory Visit | Attending: Neurosurgery | Admitting: Neurosurgery

## 2022-12-19 DIAGNOSIS — M4802 Spinal stenosis, cervical region: Secondary | ICD-10-CM

## 2022-12-19 DIAGNOSIS — R200 Anesthesia of skin: Secondary | ICD-10-CM

## 2022-12-19 DIAGNOSIS — M5412 Radiculopathy, cervical region: Secondary | ICD-10-CM

## 2023-01-16 ENCOUNTER — Encounter: Payer: Self-pay | Admitting: Neurosurgery

## 2023-01-16 ENCOUNTER — Ambulatory Visit (INDEPENDENT_AMBULATORY_CARE_PROVIDER_SITE_OTHER): Payer: BC Managed Care – PPO | Admitting: Neurosurgery

## 2023-01-16 VITALS — BP 124/82 | Ht 64.0 in | Wt 160.0 lb

## 2023-01-16 DIAGNOSIS — M5412 Radiculopathy, cervical region: Secondary | ICD-10-CM

## 2023-01-16 DIAGNOSIS — M542 Cervicalgia: Secondary | ICD-10-CM

## 2023-01-16 DIAGNOSIS — G935 Compression of brain: Secondary | ICD-10-CM

## 2023-01-16 DIAGNOSIS — R200 Anesthesia of skin: Secondary | ICD-10-CM | POA: Diagnosis not present

## 2023-01-16 DIAGNOSIS — M4802 Spinal stenosis, cervical region: Secondary | ICD-10-CM | POA: Diagnosis not present

## 2023-01-16 DIAGNOSIS — R202 Paresthesia of skin: Secondary | ICD-10-CM

## 2023-01-16 NOTE — Progress Notes (Signed)
Referring Physician:  No referring provider defined for this encounter.  Primary Physician:  Joy Nurse, MD  History of Present Illness: 01/16/2023 Joy [name] to returns to see me.  She continues to have right sided shoulder blade and arm pain.  She is scheduled for an injection on November 19.  12/05/2022 Joy [name] has developed R body numbness which she noted last month.  She was noted to have low B12, and was started on supplementation.  She still has numbness on Joy right side of her body from her chest down to her leg.  She continues to have some neck pain.  She is having some pain into her shoulder blades bilaterally.  She does not have any pain below her elbows.     07/25/2022 She has been doing physical therapy and had an injection.  She is feeling much better.  She has no pain.  She did get Achilles tendinitis in her right Achilles tendon, but is being treated as an outpatient for this.  05/25/2022 Joy [name] is hurting from multiple biopsies of her thyroid gland.  Her neck pain is overall a little bit better.  She has had an epidural steroid injection which has helped her arm pain.  She had a trigger point injection on Monday which helped.  She feels that she is making forward progress.  03/28/2022 Joy [name] is here today with a chief complaint of neck pain that radiates into Joy right arm and numbness and tingling in Joy left arm/hand. In October 2023 she started noticing that she was dropping things.   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  Similar to her presentation at that time, she does not have any clearly Chiari related headaches at this time.  She has a headache disorder, but it is more consistent with migraine headaches.  She does not have headaches with sneezing or coughing.   She reports 7 to 8 months of numbness and tingling in her left greater than right  arm.  She gets pain into her trapezius muscle on Joy right side particular when she rotates her head to Joy left.  She has numbness in her third and fourth fingers.  She has been dropping items at times.  She has limited range of motion which causes pain in her neck.  She did have a left-sided carpal tunnel injection which did help her symptoms.  Bowel/Bladder Dysfunction: none  Conservative measures: EMG on 02/14/22 Physical therapy: has not participated in   Multimodal medical therapy including regular antiinflammatories: none  Injections: has not received any epidural steroid injections  Past Surgery: denies  Audi Joy [name] has no symptoms of cervical myelopathy.  Joy symptoms are causing a significant impact on Joy [name]'s life.   I have utilized Joy care everywhere function in epic to review Joy outside records available from external health systems.  Review of Systems:  A 10 point review of systems is negative, except for Joy pertinent positives and negatives detailed in Joy HPI.  Past Medical History: Past Medical History:  Diagnosis Date   Anemia    History of chicken pox    Migraines     Past Surgical History: Past Surgical History:  Procedure Laterality Date   ABDOMINAL HYSTERECTOMY     BREAST BIOPSY Right 10/12/2022   [place] RT BREAST BX W LOC DEV 1ST LESION IMG BX  SPEC US GUIDE 10/12/2022 [facility]   CESAREAN SECTION     COLONOSCOPY WITH PROPOFOL N/A 04/04/2021   Procedure: COLONOSCOPY WITH PROPOFOL;  Surgeon: Regis Bill, MD;  Location: [facility] ENDOSCOPY;  Service: Endoscopy;  Laterality: N/A;   DIAGNOSTIC LAPAROSCOPY     JOINT REPLACEMENT     KNEE ASL RECONSTRUCTION Left    NOSE SURGERY     TUBAL LIGATION     WISDOM TEETH REMOVAL      Allergies: Allergies as of 01/16/2023 - Review Complete 01/16/2023  Allergen Reaction Noted   Latex  02/17/2017    Medications: Current Meds  Medication Sig   clobetasol ointment (TEMOVATE) 0.05 % Apply 1  application topically 2 (two) times a week.   DOTTI 0.025 MG/24HR Place onto Joy skin.   escitalopram (LEXAPRO) 10 MG tablet Take 10 mg by mouth daily.   gabapentin (NEURONTIN) 300 MG capsule Take 300 mg by mouth daily.   ketoconazole (NIZORAL) 2 % shampoo Apply topically.   MOBIC 7.5 MG tablet Take 7.5 mg by mouth daily.   nortriptyline (PAMELOR) 10 MG capsule TAKE 20-30 MG AT NIGHT    Social History: Social History   Tobacco Use   Smoking status: Never   Smokeless tobacco: Never  Vaping Use   Vaping status: Never Used  Substance Use Topics   Alcohol use: Yes    Comment: occasional   Drug use: No    Family Medical History: Family History  Problem Relation Age of Onset   Cancer Father        non hodginks lymphoma   Cancer Maternal Grandmother        vulva   Breast cancer Neg Hx     Physical Examination: Vitals:   01/16/23 1335  BP: 124/82     General: [name] is well developed, well nourished, calm, collected, and in no apparent distress. Attention to examination is appropriate.  Neck:   Supple.  Limited range of motion on extension and rotation with discomfort in all 3 of those directions.  She is more limited on rotation to Joy right than left.  Respiratory: [name] is breathing without any difficulty.   NEUROLOGICAL:     Awake, alert, oriented to person, place, and time.  Speech is clear and fluent.   Cranial Nerves: Pupils equal round and reactive to light.  Facial tone is symmetric.  Facial sensation is symmetric. Shoulder shrug is symmetric. Tongue protrusion is midline.  There is no pronator drift.  ROM of spine: full.    Strength: Side Biceps Triceps Deltoid Interossei Grip Wrist Ext. Wrist Flex.  R 5 5 5 5 5 5 5   L 5 5 5 5 5 5 5    Bilateral upper and lower extremity sensation is intact to light touch.    No evidence of dysmetria noted.  Gait is normal.     Medical Decision Making  Imaging: MRI C spine 03/08/2022 IMPRESSION: 1.  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 2. 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 3. Chiari 1 malformation with Joy cerebellar tonsils extending approximately 10 mm below Joy foramen magnum. No syrinx. 4. 1.5 cm right thyroid nodule, indeterminate. Further evaluation with dedicated thyroid ultrasound recommended (ref: J Am Coll Radiol. 2015 Feb;12(2): 143-50).     Electronically Signed   By: Rise Mu M.D.   On: 03/09/2022 05:09  MRI C spine 12/19/2022 Disc levels:  Foramen magnum through C2-3 are otherwise normal.   C3-4: Minimal disc bulge.  No stenosis.   C4-5: Mild bilateral uncovertebral hypertrophy. No compressive stenosis.   C5-6: Spondylosis with endplate osteophytes and shallow protrusion of Joy disc more prominent in Joy left posterolateral direction. No compressive effect upon Joy cord. Mild foraminal narrowing on Joy right. Moderate foraminal narrowing on Joy left. Some potential Joy left C6 nerve could be affected.   C6-7: Normal   C7-T1: Normal.   IMPRESSION: 1. Chiari malformation. No cord compression or focal cord lesion. 2. C5-6: Spondylosis with endplate osteophytes and shallow protrusion of Joy disc more prominent in Joy left posterolateral direction. No compressive effect upon Joy cord. Mild foraminal narrowing on Joy right. Moderate foraminal narrowing on Joy left. Some potential Joy left C6 nerve could be affected. No apparent change since last year. 3. C4-5: Mild bilateral uncovertebral hypertrophy. No compressive stenosis.     Electronically Signed   By: Paulina Fusi M.D.   On: 01/09/2023 16:23  I have personally reviewed Joy images and agree with Joy above interpretation.  EMG 02/14/2022 Impression: Normal study. There is no electrodiagnostic evidence of a large fiber neuropathy in Joy left  upper extremity.   Thank you for Joy referral of this [name]. It was our privilege to participate in care of your [name]. Feel free to contact us with any further questions.  _____________________________ Theora Master, M.D.   Assessment and Plan: Joy [name] is a pleasant 47 y.o. female with neck pain, radicular pain.  Her symptoms are intermittent.  She is having an injection in 2 weeks.  I would like to follow-up with her via telephone approximately 2 weeks after that.  I do not think she will ever need intervention on her Chiari malformation.  I feel that she has some right C4-5 and left C5-6 neuroforaminal disease on her MRI scan.  She also has some hand symptoms that could be referable to carpal tunnel syndrome.  I would like to follow-up with her to determine how significant her symptoms are in a couple of weeks after her injection.    Depending on Joy severity of her symptoms, we may continue to monitor or consider intervention at some point.  I spent a total of 10 minutes in this [name]'s care today. This time was spent reviewing pertinent records including imaging studies, obtaining and confirming history, performing a directed evaluation, formulating and discussing my recommendations, and documenting Joy visit within Joy medical record.      Thank you for involving me in Joy care of this [name].      Rey Fors K. Myer Haff MD, [facility] Neurosurgery

## 2023-02-15 ENCOUNTER — Ambulatory Visit: Payer: BC Managed Care – PPO | Admitting: Neurosurgery

## 2023-02-15 DIAGNOSIS — M4802 Spinal stenosis, cervical region: Secondary | ICD-10-CM

## 2023-02-15 DIAGNOSIS — M5412 Radiculopathy, cervical region: Secondary | ICD-10-CM | POA: Diagnosis not present

## 2023-02-15 DIAGNOSIS — M542 Cervicalgia: Secondary | ICD-10-CM | POA: Diagnosis not present

## 2023-02-15 NOTE — Progress Notes (Signed)
Referring Physician:  Gracelyn Nurse, MD [id number] [facility] [place],  [place] [address]  Primary Physician:  Gracelyn Nurse, MD  History of Present Illness: 02/15/2023 She had a right C4 5 injection since I last saw her.  She had a brief incomplete improvement during the lidocaine phase of the injection.  She is interested in trying acupuncture.   01/16/2023 Joy Brown to returns to see me.  She continues to have right sided shoulder blade and arm pain.  She is scheduled for an injection on November 19.  12/05/2022 Joy Brown has developed R body numbness which she noted last month.  She was noted to have low B12, and was started on supplementation.  She still has numbness on the right side of her body from her chest down to her leg.  She continues to have some neck pain.  She is having some pain into her shoulder blades bilaterally.  She does not have any pain below her elbows.     07/25/2022 She has been doing physical therapy and had an injection.  She is feeling much better.  She has no pain.  She did get Achilles tendinitis in her right Achilles tendon, but is being treated as an outpatient for this.  05/25/2022 Joy Brown is hurting from multiple biopsies of her thyroid gland.  Her neck pain is overall a little bit better.  She has had an epidural steroid injection which has helped her arm pain.  She had a trigger point injection on Monday which helped.  She feels that she is making forward progress.  03/28/2022 Joy Brown is here today with a chief complaint of neck pain that radiates into the right arm and numbness and tingling in the left arm/hand. In October 2023 she started noticing that she was dropping things.   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  Similar to her presentation at that time, she does not have any clearly Chiari related headaches at this  time.  She has a headache disorder, but it is more consistent with migraine headaches.  She does not have headaches with sneezing or coughing.   She reports 7 to 8 months of numbness and tingling in her left greater than right arm.  She gets pain into her trapezius muscle on the right side particular when she rotates her head to the left.  She has numbness in her third and fourth fingers.  She has been dropping items at times.  She has limited range of motion which causes pain in her neck.  She did have a left-sided carpal tunnel injection which did help her symptoms.  Bowel/Bladder Dysfunction: none  Conservative measures: EMG on 02/14/22 Physical therapy: has not participated in   Multimodal medical therapy including regular antiinflammatories: none  Injections: has not received any epidural steroid injections  Past Surgery: denies  Joy Brown has no symptoms of cervical myelopathy.  The symptoms are causing a significant impact on the patient's life.   I have utilized the care everywhere function in epic to review the outside records available from external health systems.  Review of Systems:  A 10 point review of systems is negative, except for the pertinent positives and negatives detailed in the HPI.  Past Medical History: Past Medical History:  Diagnosis Date   Anemia    History of chicken pox  Migraines     Past Surgical History: Past Surgical History:  Procedure Laterality Date   ABDOMINAL HYSTERECTOMY     BREAST BIOPSY Right 10/12/2022   [place] RT BREAST BX W LOC DEV 1ST LESION IMG BX SPEC US GUIDE 10/12/2022 [facility]   CESAREAN SECTION     COLONOSCOPY WITH PROPOFOL N/A 04/04/2021   Procedure: COLONOSCOPY WITH PROPOFOL;  Surgeon: Regis Bill, MD;  Location: [facility]OS[facility];  Service: Endoscopy;  Laterality: N/A;   DIAGNOSTIC LAPAROSCOPY     JOINT REPLACEMENT     KNEE ASL RECONSTRUCTION Left    NOSE SURGERY     TUBAL LIGATION     WISDOM TEETH REMOVAL       Allergies: Allergies as of 02/15/2023 - Review Complete 01/16/2023  Allergen Reaction Noted   Latex  02/17/2017    Medications: No outpatient medications have been marked as taking for the 02/15/23 encounter (Appointment) with Venetia Night, MD.    Social History: Social History   Tobacco Use   Smoking status: Never   Smokeless tobacco: Never  Vaping Use   Vaping status: Never Used  Substance Use Topics   Alcohol use: Yes    Comment: occasional   Drug use: No    Family Medical History: Family History  Problem Relation Age of Onset   Cancer Father        non hodginks lymphoma   Cancer Maternal Grandmother        vulva   Breast cancer Neg Hx     Physical Examination: There were no vitals filed for this visit.  Medical Decision Making  Imaging: MRI C spine 03/08/2022 IMPRESSION: 1. 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 2. 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 3. 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 No syrinx. 4. 1.5 cm right thyroid nodule, indeterminate. Further evaluation with dedicated thyroid ultrasound recommended (ref: J Am Coll Radiol. 2015 Feb;12(2): 143-50).     Electronically Signed   By: Rise Mu M.D.   On: 03/09/2022 05:09  MRI C spine 12/19/2022 Disc levels:   Foramen magnum through C2-3 are otherwise normal.   C3-4: Minimal disc bulge.  No stenosis.   C4-5: Mild bilateral uncovertebral hypertrophy. No compressive stenosis.   C5-6: Spondylosis with endplate osteophytes and shallow protrusion of the disc more prominent in the left posterolateral direction. No compressive effect upon the cord. Mild foraminal narrowing on the right. Moderate foraminal narrowing on the left. Some potential the left C6 nerve could  be affected.   C6-7: Normal   C7-T1: Normal.   IMPRESSION: 1. Chiari malformation. No cord compression or focal cord lesion. 2. C5-6: Spondylosis with endplate osteophytes and shallow protrusion of the disc more prominent in the left posterolateral direction. No compressive effect upon the cord. Mild foraminal narrowing on the right. Moderate foraminal narrowing on the left. Some potential the left C6 nerve could be affected. No apparent change since last year. 3. C4-5: Mild bilateral uncovertebral hypertrophy. No compressive stenosis.     Electronically Signed   By: Paulina Fusi M.D.   On: 01/09/2023 16:23  I have personally reviewed the images and agree with the above interpretation.  EMG 02/14/2022 Impression: Normal study. There is no electrodiagnostic evidence of a large fiber neuropathy in the left upper extremity.   Thank you for the referral of this patient.  It was our privilege to participate in care of your patient. Feel free to contact us with any further questions.  _____________________________ Theora Master, M.D.   Assessment and Plan: Joy Brown is a pleasant 47 y.o. female with neck pain, radicular pain.  Her symptoms are intermittent.     She would like to try acupuncture.  I think that is reasonable.  I will touch base with the radiologist for reevaluation of the right C4-5 level.  She will touch base with me over the coming weeks to let me know how she is doing.  This visit was performed via telephone.  Patient location: home Provider location: office  I spent a total of 5 minutes non-face-to-face activities for this visit on the date of this encounter including review of current clinical condition and response to treatment.  The patient is aware of and accepts the limits of this telehealth visit.     Thank you for involving me in the care of this patient.      Rolando Whitby K. Myer Haff MD, [facility] Neurosurgery

## 2023-02-17 ENCOUNTER — Encounter: Payer: Self-pay | Admitting: Neurosurgery

## 2023-05-21 ENCOUNTER — Other Ambulatory Visit: Payer: BC Managed Care – PPO

## 2023-05-22 ENCOUNTER — Ambulatory Visit
Admission: RE | Admit: 2023-05-22 | Discharge: 2023-05-22 | Disposition: A | Discharge status: Home / Self Care | Payer: BC Managed Care – PPO | Source: Ambulatory Visit | Attending: Unknown Physician Specialty | Admitting: Unknown Physician Specialty

## 2023-05-22 DIAGNOSIS — E041 Nontoxic single thyroid nodule: Secondary | ICD-10-CM

## 2023-11-09 ENCOUNTER — Encounter: Payer: Self-pay | Admitting: Emergency Medicine

## 2023-11-09 ENCOUNTER — Emergency Department

## 2023-11-09 ENCOUNTER — Ambulatory Visit: Admission: EM | Admit: 2023-11-09 | Discharge: 2023-11-09 | Disposition: A | Discharge status: Home / Self Care

## 2023-11-09 ENCOUNTER — Other Ambulatory Visit: Payer: Self-pay

## 2023-11-09 ENCOUNTER — Emergency Department
Admission: EM | Admit: 2023-11-09 | Discharge: 2023-11-09 | Disposition: A | Discharge status: Home / Self Care | Source: Ambulatory Visit | Attending: Emergency Medicine | Admitting: Emergency Medicine

## 2023-11-09 DIAGNOSIS — R079 Chest pain, unspecified: Secondary | ICD-10-CM | POA: Diagnosis present

## 2023-11-09 DIAGNOSIS — R0789 Other chest pain: Secondary | ICD-10-CM | POA: Insufficient documentation

## 2023-11-09 LAB — CBC
HCT: 39.2 % (ref 36.0–46.0)
Hemoglobin: 13.1 g/dL (ref 12.0–15.0)
MCH: 30.8 pg (ref 26.0–34.0)
MCHC: 33.4 g/dL (ref 30.0–36.0)
MCV: 92 fL (ref 80.0–100.0)
Platelets: 183 K/uL (ref 150–400)
RBC: 4.26 MIL/uL (ref 3.87–5.11)
RDW: 13.2 % (ref 11.5–15.5)
WBC: 5.7 K/uL (ref 4.0–10.5)
nRBC: 0 % (ref 0.0–0.2)

## 2023-11-09 LAB — BASIC METABOLIC PANEL WITH GFR
Anion gap: 9 (ref 5–15)
BUN: 8 mg/dL (ref 6–20)
CO2: 26 mmol/L (ref 22–32)
Calcium: 9.2 mg/dL (ref 8.9–10.3)
Chloride: 103 mmol/L (ref 98–111)
Creatinine, Ser: 0.85 mg/dL (ref 0.44–1.00)
GFR, Estimated: >60 mL/min (ref >60)
Glucose, Bld: 97 mg/dL (ref 70–99)
Potassium: 3.6 mmol/L (ref 3.5–5.1)
Sodium: 138 mmol/L (ref 135–145)

## 2023-11-09 LAB — TROPONIN I (HIGH SENSITIVITY)
Troponin I (High Sensitivity): <2 ng/L (ref <18)
Troponin I (High Sensitivity): <2 ng/L (ref <18)

## 2023-11-09 MED ORDER — PANTOPRAZOLE SODIUM 40 MG PO TBEC
40.0000 mg | DELAYED_RELEASE_TABLET | Freq: Every day | ORAL | 1 refills | Status: AC
Start: 2023-11-09 — End: 2024-11-08

## 2023-11-09 NOTE — ED Notes (Signed)
 Patient is being discharged from the Urgent Care and sent to the Emergency Department via POV . Per A. White, NP, patient is in need of higher level of care due to Chest Pain. Patient is aware and verbalizes understanding of plan of care.  Vitals:   11/09/23 0926  BP: 128/65  Pulse: 93  Resp: 20  Temp: 98.3 F (36.8 C)  SpO2: 97%

## 2023-11-09 NOTE — ED Triage Notes (Signed)
 Patient complains of right upper chest pain radiating down right arm that started suddenly at 8 am this morning and states she had indigestion terribly yesterday. Patient has not taken anything for symptoms. Rates pain 2/10.

## 2023-11-09 NOTE — ED Provider Notes (Signed)
 Patient presents for evaluation of right-sided chest pain beginning this morning at 8 AM, radiating down the right arm stopping at the elbow.  Described as sharp and shooting, rating a 2 out of 10.  Occurring intermittently without pattern.  Endorses severe indigestion 1 day ago making it difficult to tolerate food and liquids, attempted over-the-counter medications which had provided no relief.  Denies personal cardiac history, familial history of MI.  Has not attempted treatment of symptoms.  Denies shortness of breath, dizziness, visual disturbance, nausea or vomiting.  Vital signs are stable and EKG shows normal sinus rhythm however as patient is currently symptomatic she is being sent to the nearest emergency department by personal vehicle for further evaluation to rule out additional cardiac conditions   Teresa Shelba SAUNDERS, NP 11/09/23 [phone]

## 2023-11-09 NOTE — Discharge Instructions (Addendum)
 As we discussed please take your medication each morning.  Return to the emergency department should you have worsening/return of chest pain or any other symptom concerning to yourself.  Otherwise you may call the number provided for cardiology to arrange a follow-up appointment for further evaluation and workup such as a stress test.

## 2023-11-09 NOTE — ED Provider Notes (Signed)
 [facility] Provider Note    Event Date/Time   First MD Initiated Contact with Patient 11/09/23 1013     (approximate)  History   Chief Complaint: Chest Pain  HPI  Joy Brown is a 48 y.o. female with a past medical history of anemia, presents to the emergency department for right sided chest pain.  According to the patient last night she was experiencing significant heartburn/indigestion which the patient states she gets from time to time but last night did not appear to be improving despite Tums and omeprazole.  Patient states however this morning did not have any indigestion symptoms.  She did note today that she has been experiencing right-sided chest pain with some radiation to the neck or arm at times.  Patient states the pain is worse if she moves her arm or presses in the right chest.  No pleuritic pain.  No leg pain or swelling.  Physical Exam   Triage Vital Signs: ED Triage Vitals [11/09/23 1002]  Encounter Vitals Group     BP (!) 140/86     Girls Systolic BP Percentile      Girls Diastolic BP Percentile      Boys Systolic BP Percentile      Boys Diastolic BP Percentile      Pulse Rate 91     Resp 18     Temp 98.4 F (36.9 C)     Temp Source Oral     SpO2 98 %     Weight      Height      Head Circumference      Peak Flow      Pain Score 5     Pain Loc      Pain Education      Exclude from Growth Chart     Most recent vital signs: Vitals:   11/09/23 1002 11/09/23 1005  BP: (!) 140/86   Pulse: 91   Resp: 18   Temp: 98.4 F (36.9 C)   SpO2: 98% 100%    General: Awake, no distress.  CV:  Good peripheral perfusion.  Regular rate and rhythm  Resp:  Normal effort.  Equal breath sounds bilaterally.  Mild right-sided chest tenderness to palpation. Abd:  No distention.  Soft, nontender.  No rebound or guarding. Other:  No lower extremity edema or tenderness.   ED Results / Procedures / Treatments   EKG  EKG viewed and  interpreted by myself shows a normal sinus rhythm at 89 bpm with a narrow QRS, normal axis, normal intervals, no concerning ST changes.  RADIOLOGY  I reviewed interpreted chest x-ray images.  No consolidation on my evaluation. Radiology has read the x-ray is negative   MEDICATIONS ORDERED IN ED: Medications - No data to display   IMPRESSION / MDM / ASSESSMENT AND PLAN / ED COURSE  I reviewed the triage vital signs and the nursing notes.  Patient's presentation is most consistent with acute presentation with potential threat to life or bodily function.  Patient presents to the emergency department for right-sided chest pain, worse with arm movement or palpation of the area.  Patient also experienced significant indigestion last night although states that has resolved.  Patient states she has been belching today but has not noted any relief of discomfort when she belches.  Patient is EKG does not appear to show any significant finding.  We will check labs including cardiac enzymes x 2.  Will continue to closely monitor while  awaiting results.  Patient agreeable to plan of care.  Patient's workup is reassuring lab work is normal including normal CBC normal chemistry negative troponin x 2.  Chest x-ray is clear.  Vital signs reassuring physical exam reassuring.  Discussed with patient likelihood of possible indigestion leading to her discomfort.  Will place patient on Protonix  each morning for the next 2 months.  Will have the patient follow-up with cardiology should she have any further chest discomfort.  Patient agreeable to plan of care and workup.  FINAL CLINICAL IMPRESSION(S) / ED DIAGNOSES   Chest pain  Note:  This document was prepared using Dragon voice recognition software and may include unintentional dictation errors.   Dorothyann Drivers, MD 11/09/23 [phone]

## 2023-11-09 NOTE — ED Triage Notes (Signed)
 Pt to ED via POV from [facility]. Pt reports right sided CP that radiates to right arm at 8am. Pt reports was having some indigestion yesterday.

## 2023-12-05 ENCOUNTER — Emergency Department

## 2023-12-05 ENCOUNTER — Encounter: Payer: Self-pay | Admitting: Emergency Medicine

## 2023-12-05 ENCOUNTER — Emergency Department
Admission: EM | Admit: 2023-12-05 | Discharge: 2023-12-05 | Disposition: A | Discharge status: Home / Self Care | Source: Ambulatory Visit | Attending: Emergency Medicine | Admitting: Emergency Medicine

## 2023-12-05 ENCOUNTER — Other Ambulatory Visit: Payer: Self-pay

## 2023-12-05 DIAGNOSIS — R051 Acute cough: Secondary | ICD-10-CM | POA: Diagnosis not present

## 2023-12-05 DIAGNOSIS — R0789 Other chest pain: Secondary | ICD-10-CM | POA: Diagnosis present

## 2023-12-05 LAB — CBC
HCT: 41.1 % (ref 36.0–46.0)
Hemoglobin: 13.8 g/dL (ref 12.0–15.0)
MCH: 31 pg (ref 26.0–34.0)
MCHC: 33.6 g/dL (ref 30.0–36.0)
MCV: 92.4 fL (ref 80.0–100.0)
Platelets: 192 K/uL (ref 150–400)
RBC: 4.45 MIL/uL (ref 3.87–5.11)
RDW: 12.9 % (ref 11.5–15.5)
WBC: 10.2 K/uL (ref 4.0–10.5)
nRBC: 0 % (ref 0.0–0.2)

## 2023-12-05 LAB — BASIC METABOLIC PANEL WITH GFR
Anion gap: 10 (ref 5–15)
BUN: 11 mg/dL (ref 6–20)
CO2: 30 mmol/L (ref 22–32)
Calcium: 9.3 mg/dL (ref 8.9–10.3)
Chloride: 98 mmol/L (ref 98–111)
Creatinine, Ser: 0.81 mg/dL (ref 0.44–1.00)
GFR, Estimated: >60 mL/min (ref >60)
Glucose, Bld: 88 mg/dL (ref 70–99)
Potassium: 4.4 mmol/L (ref 3.5–5.1)
Sodium: 138 mmol/L (ref 135–145)

## 2023-12-05 LAB — TROPONIN I (HIGH SENSITIVITY): Troponin I (High Sensitivity): <2 ng/L (ref <18)

## 2023-12-05 MED ORDER — IPRATROPIUM-ALBUTEROL 0.5-2.5 (3) MG/3ML IN SOLN
3.0000 mL | Freq: Once | RESPIRATORY_TRACT | Status: AC
  Administered 2023-12-05: 3 mL via RESPIRATORY_TRACT
  Filled 2023-12-05: qty 3

## 2023-12-05 MED ORDER — ALBUTEROL SULFATE HFA 108 (90 BASE) MCG/ACT IN AERS: 2.0000 | INHALATION_SPRAY | Freq: Four times a day (QID) | RESPIRATORY_TRACT | 0 refills | Status: AC | PRN

## 2023-12-05 NOTE — ED Triage Notes (Signed)
 Patient to ED from [facility] for chest tightness. States ongoing since last week- states she has been on prednisone and antibiotics for same. NAD noted. Denies cardiac hx.

## 2023-12-05 NOTE — Discharge Instructions (Signed)
 You were seen in the ER today for your cough and congestion with associated chest tightness.  Your testing was fortunately overall reassuring.  I sent a prescription for an albuterol  inhaler to your pharmacy that you can take as needed to help with your respiratory symptoms.  Follow with your primary care doctor for further evaluation.  Return to the ER for new or worsening symptoms.

## 2023-12-05 NOTE — ED Triage Notes (Signed)
 Arrives from [facility] for ED evaluation of continued chest congestion and tightness.  Has had coarse of antibiotics, but sympotms persist.  Symptoms initially started 10-14 days ago.  AAOx3. Skin warm and dry. No SOB/DOE. NAD

## 2023-12-05 NOTE — ED Provider Notes (Signed)
 [facility] Provider Note    Event Date/Time   First MD Initiated Contact with Patient 12/05/23 1401     (approximate)   History   No chief complaint on file.   HPI  Joy Brown is a 48 year old female presenting to the emergency department for evaluation of cough and chest tightness.  Last week, patient had cough and congestion was seen at urgent care.  She was placed on prednisone and antibiotics.  Reports that since that time her cough has somewhat improved but she continues to have congestion with associated chest tightness.  No history of asthma or COPD.  Today, she went for follow-up in the clinic as her symptoms had not fully improved but was directed to the ER in the setting of chest tightness.  Reports heaviness in her chest that she associates with her cough, otherwise denies chest pain.  No shortness of breath.  Reviewed urgent care visit, diagnosed with acute bacterial sinusitis as well as bronchitis with bronchospasm.  Discharged on Doxy, prednisone, Tessalon Perles.  Also treated for UTI at that time.     Physical Exam   Triage Vital Signs: ED Triage Vitals  Encounter Vitals Group     BP 12/05/23 1224 (!) 144/92     Girls Systolic BP Percentile --      Girls Diastolic BP Percentile --      Boys Systolic BP Percentile --      Boys Diastolic BP Percentile --      Pulse Rate 12/05/23 1224 93     Resp 12/05/23 1224 17     Temp 12/05/23 1224 97.9 F (36.6 C)     Temp Source 12/05/23 1224 Oral     SpO2 12/05/23 1224 100 %     Weight 12/05/23 1225 168 lb (76.2 kg)     Height 12/05/23 1225 5' 3 (1.6 m)     Head Circumference --      Peak Flow --      Pain Score 12/05/23 1224 4     Pain Loc --      Pain Education --      Exclude from Growth Chart --     Most recent vital signs: Vitals:   12/05/23 1224 12/05/23 1609  BP: (!) 144/92 132/84  Pulse: 93 94  Resp: 17 18  Temp: 97.9 F (36.6 C) 97.8 F (36.6 C)  SpO2: 100% 100%      General: Awake, interactive  CV:  Regular rate, good peripheral perfusion.  Resp:  Unlabored respirations, mildly diminished, lungs otherwise clear to auscultation Chest wall: No significant tenderness to palpation Abd:  Nondistended.  Neuro:  Symmetric facial movement, fluid speech   ED Results / Procedures / Treatments   Labs (all labs ordered are listed, but only abnormal results are displayed) Labs Reviewed  BASIC METABOLIC PANEL WITH GFR  CBC  TROPONIN I (HIGH SENSITIVITY)     EKG EKG independently reviewed and interpreted by myself demonstrates:  EKG demonstrates normal sinus rhythm at a rate of 89, PR 144, QRS 90, QTc 455, no acute ST changes  RADIOLOGY Imaging independently reviewed and interpreted by myself demonstrates:  CXR without focal consolidation  Formal Radiology Read:  DG Chest 2 View Result Date: 12/05/2023 EXAM: 2 VIEW(S) XRAY OF THE CHEST 12/05/2023 12:44:02 PM COMPARISON: 11/09/2023 CLINICAL HISTORY: cp. Patient to ED from [facility] for chest tightness. States ongoing since last week- states she has been on prednisone and antibiotics for same. NAD noted.  Denies cardiac hx. FINDINGS: LUNGS AND PLEURA: No focal pulmonary opacity. No pulmonary edema. No pleural effusion. No pneumothorax. HEART AND MEDIASTINUM: No acute abnormality of the cardiac and mediastinal silhouettes. BONES AND SOFT TISSUES: No acute osseous abnormality. IMPRESSION: 1. Normal chest radiograph. No acute cardiopulmonary process. Electronically signed by: Waddell Calk MD 12/05/2023 01:17 PM EDT RP Workstation: [id number]    PROCEDURES:  Critical Care performed: No  Procedures   MEDICATIONS ORDERED IN ED: Medications  ipratropium-albuterol  (DUONEB) 0.5-2.5 (3) MG/3ML nebulizer solution 3 mL (3 mLs Nebulization Given 12/05/23 1453)     IMPRESSION / MDM / ASSESSMENT AND PLAN / ED COURSE  I reviewed the triage vital signs and the nursing notes.  Differential diagnosis includes, but  is not limited to, viral illness, superimposed pneumonia, lower suspicion pneumothorax, much lower suspicion ACS, consideration for musculoskeletal strain  Patient's presentation is most consistent with acute presentation with potential threat to life or bodily function.  48 year old female presenting to the emergency department for evaluation of ongoing respiratory symptoms after recent respiratory illness, also with associated chest tightness.  Vital signs reassuring.  Reassuring CBC, CMP, negative troponin with well over 3 hours of symptoms.  Low clinical suspicion for ACS based on history.  Chest x-Journe Hallmark without focal consolidation.  Suspect likely ongoing sequela from upper respiratory illness.  Does have some diminished breath sounds here so did trial a DuoNeb treatment.  Patient did seem to think that this improved her symptoms.  Very low suspicion cardiac chest pain.  Low risk heart score.  With this, will DC with albuterol  inhaler.  Instructed to finish her antibiotic course, continue to take Tessalon Perles as needed.  Strict return precautions provided.  Patient discharged in stable condition.      FINAL CLINICAL IMPRESSION(S) / ED DIAGNOSES   Final diagnoses:  Acute cough  Chest tightness     Rx / DC Orders   ED Discharge Orders          Ordered    albuterol  (VENTOLIN  HFA) 108 (90 Base) MCG/ACT inhaler  Every 6 hours PRN        12/05/23 1632             Note:  This document was prepared using Dragon voice recognition software and may include unintentional dictation errors.   Levander Slate, MD 12/05/23 [id number]

## 2024-02-27 ENCOUNTER — Other Ambulatory Visit: Payer: Self-pay | Admitting: Internal Medicine

## 2024-02-27 DIAGNOSIS — Z1231 Encounter for screening mammogram for malignant neoplasm of breast: Secondary | ICD-10-CM

## 2024-02-29 ENCOUNTER — Ambulatory Visit
Admission: RE | Admit: 2024-02-29 | Discharge: 2024-02-29 | Disposition: A | Discharge status: Home / Self Care | Source: Ambulatory Visit | Attending: Internal Medicine | Admitting: Internal Medicine

## 2024-02-29 DIAGNOSIS — Z1231 Encounter for screening mammogram for malignant neoplasm of breast: Secondary | ICD-10-CM | POA: Insufficient documentation
